# Patient Record
Sex: Male | Born: 1940
Health system: Southern US, Community
[De-identification: ages and names within clinical notes are randomized; demographics above are authoritative.]

## PROBLEM LIST (undated history)

## (undated) DIAGNOSIS — R251 Tremor, unspecified: Secondary | ICD-10-CM

## (undated) DIAGNOSIS — E785 Hyperlipidemia, unspecified: Secondary | ICD-10-CM

## (undated) DIAGNOSIS — I10 Essential (primary) hypertension: Secondary | ICD-10-CM

## (undated) DIAGNOSIS — D696 Thrombocytopenia, unspecified: Secondary | ICD-10-CM

## (undated) DIAGNOSIS — I480 Paroxysmal atrial fibrillation: Secondary | ICD-10-CM

## (undated) DIAGNOSIS — Z8669 Personal history of other diseases of the nervous system and sense organs: Secondary | ICD-10-CM

## (undated) DIAGNOSIS — T7840XA Allergy, unspecified, initial encounter: Secondary | ICD-10-CM

## (undated) DIAGNOSIS — E119 Type 2 diabetes mellitus without complications: Secondary | ICD-10-CM

## (undated) HISTORY — DX: Type 2 diabetes mellitus without complications: E11.9

## (undated) HISTORY — DX: Allergy, unspecified, initial encounter: T78.40XA

## (undated) HISTORY — DX: Thrombocytopenia, unspecified: D69.6

## (undated) HISTORY — DX: Personal history of other diseases of the nervous system and sense organs: Z86.69

## (undated) HISTORY — DX: Tremor, unspecified: R25.1

## (undated) HISTORY — DX: Paroxysmal atrial fibrillation: I48.0

## (undated) HISTORY — DX: Essential (primary) hypertension: I10

## (undated) HISTORY — DX: Hyperlipidemia, unspecified: E78.5

## (undated) HISTORY — PX: KIDNEY STONE SURGERY: SHX686

## (undated) HISTORY — PX: COLONOSCOPY: SHX174

---

## 2008-06-27 DIAGNOSIS — Z8546 Personal history of malignant neoplasm of prostate: Secondary | ICD-10-CM

## 2008-06-27 HISTORY — DX: Personal history of malignant neoplasm of prostate: Z85.46

## 2008-06-27 HISTORY — PX: PROSTATECTOMY: SHX69

## 2012-06-27 DIAGNOSIS — Z8679 Personal history of other diseases of the circulatory system: Secondary | ICD-10-CM

## 2012-06-27 HISTORY — DX: Personal history of other diseases of the circulatory system: Z86.79

## 2012-06-27 HISTORY — PX: ATRIAL FIBRILLATION ABLATION: EP1191

## 2015-06-29 ENCOUNTER — Encounter: Payer: Self-pay | Admitting: Cardiovascular Disease

## 2019-07-19 ENCOUNTER — Other Ambulatory Visit: Payer: Self-pay

## 2019-07-19 ENCOUNTER — Ambulatory Visit (INDEPENDENT_AMBULATORY_CARE_PROVIDER_SITE_OTHER): Payer: Medicare Other | Admitting: Podiatry

## 2019-07-19 ENCOUNTER — Encounter: Payer: Self-pay | Admitting: Podiatry

## 2019-07-19 DIAGNOSIS — M79674 Pain in right toe(s): Secondary | ICD-10-CM

## 2019-07-19 DIAGNOSIS — B351 Tinea unguium: Secondary | ICD-10-CM | POA: Diagnosis not present

## 2019-07-19 DIAGNOSIS — E1142 Type 2 diabetes mellitus with diabetic polyneuropathy: Secondary | ICD-10-CM | POA: Diagnosis not present

## 2019-07-19 DIAGNOSIS — M79675 Pain in left toe(s): Secondary | ICD-10-CM | POA: Diagnosis not present

## 2019-07-19 DIAGNOSIS — E1140 Type 2 diabetes mellitus with diabetic neuropathy, unspecified: Secondary | ICD-10-CM | POA: Insufficient documentation

## 2019-07-19 NOTE — Progress Notes (Signed)
This patient presents the office for diabetic foot exam as well as requesting diabetic shoes.  Patient is diabetic and is taking insulin and Metformin.  Patient has been diagnosed with diabetic neuropathy.  He presents the office today requesting diabetic shoes.    Vascular  Dorsalis pedis  palpable  B/left foot.  No palpable pulses right foot.  Capillary return  WNL.  Temperature gradient is  WNL.  Skin turgor  WNL  Sensorium  Senn Weinstein monofilament wire  absent . Normal tactile sensation.  Nail Exam  Patient has normal nails with no evidence of bacterial or fungal infection.  Orthopedic  Exam  Muscle tone and muscle strength  WNL.  No limitations of motion feet  B/L.  No crepitus or joint effusion noted.  Foot type is unremarkable and digits show no abnormalities.  Mild  HAV  B/L  Skin  No open lesions.  Normal skin texture and turgor.   Diabetic neuropathy.    IE.  Diabetic foot exam reveals no evidence of vascular pathology.  Patient has diminished/absent  LOPS  B/L.  Patient qualifies for diabetic shoes due to DPN and  HAV  B/L.  Patient to make an appointment with the pedorthikst.  RTC 1 year for annual foot exam.   Helane Gunther DPM

## 2019-07-22 ENCOUNTER — Ambulatory Visit: Payer: Medicare Other | Attending: Internal Medicine

## 2019-07-22 DIAGNOSIS — Z23 Encounter for immunization: Secondary | ICD-10-CM

## 2019-07-22 NOTE — Progress Notes (Signed)
   Covid-19 Vaccination Clinic  Name:  Man Effertz    MRN: [id number] DOB: [birth year]  07/22/2019  [name] was observed post Covid-19 immunization for 15 minutes without incidence. He was provided with Vaccine Information Sheet and instruction to access the V-Safe system.   Mr. Handler was instructed to call 911 with any severe reactions post vaccine: Marland Kitchen Difficulty breathing  . Swelling of your face and throat  . A fast heartbeat  . A bad rash all over your body  . Dizziness and weakness    Immunizations Administered    Name Date Dose VIS Date Route   Pfizer COVID-19 Vaccine 07/22/2019 11:22 AM 0.3 mL 06/07/2019 Intramuscular   Manufacturer: [employer], [employer]   Lot: [id number]   NDC: [address]-6

## 2019-07-25 ENCOUNTER — Ambulatory Visit: Payer: Medicare Other | Admitting: Orthotics

## 2019-07-25 ENCOUNTER — Ambulatory Visit (INDEPENDENT_AMBULATORY_CARE_PROVIDER_SITE_OTHER): Payer: Medicare Other | Admitting: Cardiovascular Disease

## 2019-07-25 ENCOUNTER — Encounter: Payer: Self-pay | Admitting: Cardiovascular Disease

## 2019-07-25 ENCOUNTER — Other Ambulatory Visit: Payer: Self-pay

## 2019-07-25 DIAGNOSIS — E782 Mixed hyperlipidemia: Secondary | ICD-10-CM | POA: Diagnosis not present

## 2019-07-25 DIAGNOSIS — E1142 Type 2 diabetes mellitus with diabetic polyneuropathy: Secondary | ICD-10-CM

## 2019-07-25 DIAGNOSIS — I6523 Occlusion and stenosis of bilateral carotid arteries: Secondary | ICD-10-CM

## 2019-07-25 DIAGNOSIS — I779 Disorder of arteries and arterioles, unspecified: Secondary | ICD-10-CM | POA: Insufficient documentation

## 2019-07-25 DIAGNOSIS — I4819 Other persistent atrial fibrillation: Secondary | ICD-10-CM

## 2019-07-25 DIAGNOSIS — E785 Hyperlipidemia, unspecified: Secondary | ICD-10-CM | POA: Insufficient documentation

## 2019-07-25 NOTE — Progress Notes (Signed)
Cardiology Office Note:    Date:  07/25/2019   ID:  Fernando Taylor, DOB 1940/07/30, MRN [id number]  PCP:  Darrow Bussing, MD  Cardiologist:  No primary care provider on file.  Electrophysiologist:  None   Referring MD: Darrow Bussing, MD   Chief Complaint  Patient presents with  . Atrial Fibrillation  Atrial fib   History of Present Illness:    Fernando Taylor is a 79 y.o. male with a hx of Atrial fib . - s/p ablation  Has hx of HTN, HLD , DM  With peripheral neuropathy,  Has developed an essential tremor .   Has rare episodes of indigestion  Had a stress test in late 2019 and then he moved to [facility] ( from [place])  Has lived in [place] years ago .  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   Was told to get a nuclear stress  Started back exercising - 1/2 hour of stationary bike No CP,  Also walks around the yard and house .   Recent labs from his primary medical doctor reveals a hemoglobin A1c of 7.1.  Total cholesterol is 174.  The HDL is 63.  The triglyceride level is 234.  The LDL is 73.  TSH is 2.61.  Glucose is 128.  Creatinine is 0.84.  Sodium is 141 potassium is 5.2 liver enzymes are normal.  CBC is normal.  PSA is less than 0.01.   Past Medical History:  Diagnosis Date  . Allergies   . Diabetes mellitus (HCC)   . History of prostate cancer 2010   NO RADIATION  . History of tremor    NOT HEN RESTING, MOSTLY INTENTION TREMOR AND AT A CERTAIN ANGLE, ABLE TO WRITE. HAS NOT HAD MORE EVALUATION, WAS BEING OBSERVED.  Marland Kitchen HLD (hyperlipidemia)   . Hx of atrial fibrillation without current medication 2014   S/P ABLATION IN [place],   . Hypertension     Past Surgical History:  Procedure Laterality Date  . ATRIAL FIBRILLATION ABLATION  2014   S/P  . COLONOSCOPY     DUE NOW, APRIL 2020  . KIDNEY STONE SURGERY    . PROSTATECTOMY  2010    Current Medications: Current Meds  Medication Sig  . Ascorbic Acid (VITAMIN C WITH ROSE HIPS) 500 MG tablet Take 500 mg by mouth daily. 10 mg rose hip  .  aspirin EC 81 MG tablet Take 81 mg by mouth daily.  . Cetirizine HCl 10 MG CAPS Take by mouth.  . Cholecalciferol (VITAMIN D3) 125 MCG (5000 UT) CAPS Take by mouth.  . diphenhydrAMINE HCl (ALLERGY MED PO) Take by mouth daily.  Marland Kitchen glimepiride (AMARYL) 4 MG tablet Take 4 mg by mouth daily with breakfast. Twice daily  . Krill Oil 500 MG CAPS Take by mouth.  . lovastatin (MEVACOR) 10 MG tablet Take 10 mg by mouth at bedtime.  . Magnesium 250 MG TABS Take by mouth daily.  Marland Kitchen METOPROLOL TARTRATE PO Take 25 mg by mouth 2 (two) times daily.  . Multiple Vitamin (MULTIVITAMIN) tablet Take 1 tablet by mouth daily.  . ramipril (ALTACE) 2.5 MG capsule Take 2.5 mg by mouth daily.  . repaglinide (PRANDIN) 1 MG tablet Take 1 mg by mouth 3 (three) times daily before meals. 1 tablet am,2 tablets  pm  . sitaGLIPtin-metformin (JANUMET) 50-1000 MG tablet Take 1 tablet by mouth 2 (two) times daily with a meal.  . SODIUM FLUORIDE 5000 SENSITIVE 1.1-5 % PSTE BRUSH WITH TWICE DAILY     Allergies:  Patient has no known allergies.   Social History   Socioeconomic History  . Marital status: Married    Spouse name: Not on file  . Number of children: Not on file  . Years of education: Not on file  . Highest education level: Not on file  Occupational History  . Not on file  Tobacco Use  . Smoking status: Never Smoker  . Smokeless tobacco: Never Used  Substance and Sexual Activity  . Alcohol use: Never  . Drug use: Never  . Sexual activity: Not on file  Other Topics Concern  . Not on file  Social History Narrative  . Not on file   Social Determinants of Health   Financial Resource Strain:   . Difficulty of Paying Living Expenses: Not on file  Food Insecurity:   . Worried About Programme researcher, broadcasting/film/video in the Last Year: Not on file  . Ran Out of Food in the Last Year: Not on file  Transportation Needs:   . Lack of Transportation (Medical): Not on file  . Lack of Transportation (Non-Medical): Not on file   Physical Activity:   . Days of Exercise per Week: Not on file  . Minutes of Exercise per Session: Not on file  Stress:   . Feeling of Stress : Not on file  Social Connections:   . Frequency of Communication with Friends and Family: Not on file  . Frequency of Social Gatherings with Friends and Family: Not on file  . Attends Religious Services: Not on file  . Active Member of Clubs or Organizations: Not on file  . Attends Banker Meetings: Not on file  . Marital Status: Not on file     Family History: The patient's family history includes CAD in his brother and father; Cerebrovascular Accident in his mother; Diabetes in his mother; Pulmonary fibrosis in his sister; Stroke in his brother.  ROS:   Please see the history of present illness.     All other systems reviewed and are negative.  EKGs/Labs/Other Studies Reviewed:    The following studies were reviewed today:   EKG:   Jan. 28, 2021 Recent Labs: No results found for requested labs within last 8760 hours.  Recent Lipid Panel No results found for: CHOL, TRIG, HDL, CHOLHDL, VLDL, LDLCALC, LDLDIRECT  Physical Exam:    VS:  BP 126/62   Pulse 65   Ht 5\' 11"  (1.803 m)   Wt 211 lb 12.8 oz (96.1 kg)   SpO2 (!) 65%   BMI 29.54 kg/m     Wt Readings from Last 3 Encounters:  07/25/19 211 lb 12.8 oz (96.1 kg)     GEN:  Well nourished, well developed in no acute distress HEENT: Normal NECK: No JVD; No carotid bruits LYMPHATICS: No lymphadenopathy CARDIAC: RRR, soft systolic murmur. RESPIRATORY:  Clear to auscultation without rales, wheezing or rhonchi  ABDOMEN: Soft, non-tender, non-distended MUSCULOSKELETAL:  No edema; No deformity  SKIN: Warm and dry NEUROLOGIC:  Alert and oriented x 3 PSYCHIATRIC:  Normal affect   ASSESSMENT:    No diagnosis found. PLAN:    In order of problems listed above:  1. History of atrial fibrillation-status post RF ablation.  Patient is in normal sinus rhythm.  He is not  had any recurrent atrial fibrillation.  We will continue to follow.  2.  Mild carotid artery disease: The patient has had a carotid scan in the past that revealed mild bilateral carotid artery disease.  Does not have  any bruits on exam.  3.  Hyperlipidemia: He is on lovastatin 10 mg a day.  His LDL is 73.  He does have elevated triglyceride levels, hemoglobin A1c, glucose levels.  I have cautioned him about eating anything white, weak, sweet.  Further management per his primary medical doctor.  2.  Hypertension: Blood pressures well controlled.  Continue current medications.   Medication Adjustments/Labs and Tests Ordered: Current medicines are reviewed at length with the patient today.  Concerns regarding medicines are outlined above.  No orders of the defined types were placed in this encounter.  No orders of the defined types were placed in this encounter.   Patient Instructions  Medication Instructions:  Your physician recommends that you continue on your current medications as directed. Please refer to the Current Medication list given to you today.  *If you need a refill on your cardiac medications before your next appointment, please call your pharmacy*  Lab Work: None Ordered If you have labs (blood work) drawn today and your tests are completely normal, you will receive your results only by: Marland Kitchen MyChart Message (if you have MyChart) OR . A paper copy in the mail If you have any lab test that is abnormal or we need to change your treatment, we will call you to review the results.   Testing/Procedures: None Ordered   Follow-Up: At [facility], you and your health needs are our priority.  As part of our continuing mission to provide you with exceptional heart care, we have created designated Provider Care Teams.  These Care Teams include your primary Cardiologist (physician) and Advanced Practice Providers (APPs -  Physician Assistants and Nurse Practitioners) who all work  together to provide you with the care you need, when you need it.  Your next appointment:   1 year(s)  The format for your next appointment:   In Person  Provider:   You may see Mertie Moores, MD or one of the following Advanced Practice Providers on your designated Care Team:    Richardson Dopp, PA-C  Vin [place], [place]  Daune Perch, [place]       Signed, Mertie Moores, MD  07/25/2019 6:30 PM    [place]

## 2019-07-25 NOTE — Progress Notes (Signed)

## 2019-07-25 NOTE — Patient Instructions (Signed)

## 2019-07-26 ENCOUNTER — Telehealth: Payer: Self-pay | Admitting: Nurse Practitioner

## 2019-07-26 NOTE — Addendum Note (Signed)
Addended by: Levi Aland on: 07/26/2019 01:00 PM   Modules accepted: Orders

## 2019-07-26 NOTE — Telephone Encounter (Signed)
Received call from patient who called to ask about documentation of O2 sat of 65% documented on his AVS. I apologized for the error and advised that unfortunately I do not have the paper copy of his vital signs from yesterday and cannot give him the accurate reading. He states he think it might have been the same number documented for pulse by mistake or I offered the suggestion that it could have ben a 6 typed in the place of a 9 since the two numbers are close to one another on the key board. Patient states he is feeling fine and thanked me for the reassurance.

## 2019-08-12 ENCOUNTER — Ambulatory Visit: Payer: Medicare Other | Attending: Internal Medicine

## 2019-08-12 DIAGNOSIS — Z23 Encounter for immunization: Secondary | ICD-10-CM | POA: Insufficient documentation

## 2019-08-12 NOTE — Progress Notes (Signed)
   Covid-19 Vaccination Clinic  Name:  Fernando Taylor    MRN: [id number] DOB: [birth year]  08/12/2019  Mr. Fernando Taylor was observed post Covid-19 immunization for 15 minutes without incidence. He was provided with Vaccine Information Sheet and instruction to access the V-Safe system.   Mr. Fernando Taylor was instructed to call 911 with any severe reactions post vaccine: Marland Kitchen Difficulty breathing  . Swelling of your face and throat  . A fast heartbeat  . A bad rash all over your body  . Dizziness and weakness    Immunizations Administered    Name Date Dose VIS Date Route   Pfizer COVID-19 Vaccine 08/12/2019 11:17 AM 0.3 mL 06/07/2019 Intramuscular   Manufacturer: [employer], [employer]   Lot: [id number]   NDC: [address]-3

## 2019-10-18 ENCOUNTER — Ambulatory Visit (INDEPENDENT_AMBULATORY_CARE_PROVIDER_SITE_OTHER): Payer: Medicare Other | Admitting: Podiatry

## 2019-10-18 ENCOUNTER — Other Ambulatory Visit: Payer: Self-pay

## 2019-10-18 ENCOUNTER — Encounter: Payer: Self-pay | Admitting: Podiatry

## 2019-10-18 VITALS — Temp 97.2°F

## 2019-10-18 DIAGNOSIS — B351 Tinea unguium: Secondary | ICD-10-CM | POA: Diagnosis not present

## 2019-10-18 DIAGNOSIS — M79675 Pain in left toe(s): Secondary | ICD-10-CM

## 2019-10-18 DIAGNOSIS — E1142 Type 2 diabetes mellitus with diabetic polyneuropathy: Secondary | ICD-10-CM

## 2019-10-18 DIAGNOSIS — M79674 Pain in right toe(s): Secondary | ICD-10-CM

## 2019-10-18 NOTE — Progress Notes (Signed)
This patient returns to my office for at risk foot care.  This patient requires this care by a professional since this patient will be at risk due to having diabetes neuropathy.  This patient is unable to cut nails himself since the patient cannot reach his nails.These nails are painful walking and wearing shoes.  This patient presents for at risk foot care today.  General Appearance  Alert, conversant and in no acute stress.  Vascular  Dorsalis pedis and posterior tibial  pulses are palpable  bilaterally.  Capillary return is within normal limits  bilaterally. Temperature is within normal limits  bilaterally.  Neurologic  Senn-Weinstein monofilament wire test within normal limits  bilaterally. Muscle power within normal limits bilaterally.  Nails Thick disfigured discolored nails with subungual debris  Hallux nails  B/L. No evidence of bacterial infection or drainage bilaterally.  Orthopedic  No limitations of motion  feet .  No crepitus or effusions noted.  No bony pathology or digital deformities noted.  Skin  normotropic skin with no porokeratosis noted bilaterally.  No signs of infections or ulcers noted.     Onychomycosis  Pain in right toes  Pain in left toes  Consent was obtained for treatment procedures.   Mechanical debridement of nails 1-5  bilaterally performed with a nail nipper.  Filed with dremel without incident.    Return office visit   3 months                  Told patient to return for periodic foot care and evaluation due to potential at risk complications.   Josceline Chenard DPM  

## 2019-12-05 ENCOUNTER — Other Ambulatory Visit: Payer: Self-pay

## 2019-12-05 ENCOUNTER — Ambulatory Visit (INDEPENDENT_AMBULATORY_CARE_PROVIDER_SITE_OTHER): Payer: Medicare Other | Admitting: Orthotics

## 2019-12-05 DIAGNOSIS — E1142 Type 2 diabetes mellitus with diabetic polyneuropathy: Secondary | ICD-10-CM

## 2020-01-22 ENCOUNTER — Encounter: Payer: Self-pay | Admitting: Podiatry

## 2020-01-22 ENCOUNTER — Ambulatory Visit (INDEPENDENT_AMBULATORY_CARE_PROVIDER_SITE_OTHER): Payer: Medicare Other | Admitting: Podiatry

## 2020-01-22 ENCOUNTER — Other Ambulatory Visit: Payer: Self-pay

## 2020-01-22 DIAGNOSIS — E1142 Type 2 diabetes mellitus with diabetic polyneuropathy: Secondary | ICD-10-CM

## 2020-01-22 DIAGNOSIS — M79674 Pain in right toe(s): Secondary | ICD-10-CM

## 2020-01-22 DIAGNOSIS — M79675 Pain in left toe(s): Secondary | ICD-10-CM | POA: Diagnosis not present

## 2020-01-22 DIAGNOSIS — B351 Tinea unguium: Secondary | ICD-10-CM

## 2020-01-22 NOTE — Progress Notes (Signed)
This patient returns to my office for at risk foot care.  This patient requires this care by a professional since this patient will be at risk due to having diabetes neuropathy.  This patient is unable to cut nails himself since the patient cannot reach his nails.These nails are painful walking and wearing shoes.  This patient presents for at risk foot care today.  General Appearance  Alert, conversant and in no acute stress.  Vascular  Dorsalis pedis and posterior tibial  pulses are palpable  bilaterally.  Capillary return is within normal limits  bilaterally. Temperature is within normal limits  bilaterally.  Neurologic  Senn-Weinstein monofilament wire test within normal limits  bilaterally. Muscle power within normal limits bilaterally.  Nails Thick disfigured discolored nails with subungual debris  Hallux nails  B/L. No evidence of bacterial infection or drainage bilaterally.  Orthopedic  No limitations of motion  feet .  No crepitus or effusions noted.  No bony pathology or digital deformities noted.  Skin  normotropic skin with no porokeratosis noted bilaterally.  No signs of infections or ulcers noted.     Onychomycosis  Pain in right toes  Pain in left toes  Consent was obtained for treatment procedures.   Mechanical debridement of nails 1-5  bilaterally performed with a nail nipper.  Filed with dremel without incident.    Return office visit   3 months                  Told patient to return for periodic foot care and evaluation due to potential at risk complications.   Berdena Cisek DPM  

## 2020-04-29 ENCOUNTER — Encounter: Payer: Self-pay | Admitting: Podiatry

## 2020-04-29 ENCOUNTER — Ambulatory Visit (INDEPENDENT_AMBULATORY_CARE_PROVIDER_SITE_OTHER): Payer: Medicare Other | Admitting: Podiatry

## 2020-04-29 ENCOUNTER — Other Ambulatory Visit: Payer: Self-pay

## 2020-04-29 DIAGNOSIS — M79675 Pain in left toe(s): Secondary | ICD-10-CM | POA: Diagnosis not present

## 2020-04-29 DIAGNOSIS — M79674 Pain in right toe(s): Secondary | ICD-10-CM | POA: Diagnosis not present

## 2020-04-29 DIAGNOSIS — B351 Tinea unguium: Secondary | ICD-10-CM | POA: Diagnosis not present

## 2020-04-29 DIAGNOSIS — E1142 Type 2 diabetes mellitus with diabetic polyneuropathy: Secondary | ICD-10-CM

## 2020-04-29 NOTE — Progress Notes (Signed)
This patient returns to my office for at risk foot care.  This patient requires this care by a professional since this patient will be at risk due to having diabetes neuropathy.  This patient is unable to cut nails himself since the patient cannot reach his nails.These nails are painful walking and wearing shoes.  This patient presents for at risk foot care today.  General Appearance  Alert, conversant and in no acute stress.  Vascular  Dorsalis pedis and posterior tibial  pulses are palpable  bilaterally.  Capillary return is within normal limits  bilaterally. Temperature is within normal limits  bilaterally.  Neurologic  Senn-Weinstein monofilament wire test within normal limits  bilaterally. Muscle power within normal limits bilaterally.  Nails Thick disfigured discolored nails with subungual debris  Hallux nails  B/L. No evidence of bacterial infection or drainage bilaterally.  Orthopedic  No limitations of motion  feet .  No crepitus or effusions noted.  No bony pathology or digital deformities noted.  Skin  normotropic skin with no porokeratosis noted bilaterally.  No signs of infections or ulcers noted.     Onychomycosis  Pain in right toes  Pain in left toes  Consent was obtained for treatment procedures.   Mechanical debridement of nails 1-5  bilaterally performed with a nail nipper.  Filed with dremel without incident.    Return office visit   3 months                  Told patient to return for periodic foot care and evaluation due to potential at risk complications.   Lovette Merta DPM  

## 2020-08-05 ENCOUNTER — Other Ambulatory Visit: Payer: Self-pay

## 2020-08-05 ENCOUNTER — Encounter: Payer: Self-pay | Admitting: Podiatry

## 2020-08-05 ENCOUNTER — Ambulatory Visit (INDEPENDENT_AMBULATORY_CARE_PROVIDER_SITE_OTHER): Payer: Medicare Other | Admitting: Podiatry

## 2020-08-05 DIAGNOSIS — M79675 Pain in left toe(s): Secondary | ICD-10-CM

## 2020-08-05 DIAGNOSIS — B351 Tinea unguium: Secondary | ICD-10-CM | POA: Diagnosis not present

## 2020-08-05 DIAGNOSIS — M79674 Pain in right toe(s): Secondary | ICD-10-CM | POA: Diagnosis not present

## 2020-08-05 DIAGNOSIS — E1142 Type 2 diabetes mellitus with diabetic polyneuropathy: Secondary | ICD-10-CM

## 2020-08-05 NOTE — Progress Notes (Signed)
This patient returns to my office for at risk foot care.  This patient requires this care by a professional since this patient will be at risk due to having diabetes neuropathy.  This patient is unable to cut nails himself since the patient cannot reach his nails.These nails are painful walking and wearing shoes.  This patient presents for at risk foot care today.  General Appearance  Alert, conversant and in no acute stress.  Vascular  Dorsalis pedis and posterior tibial  pulses are palpable  bilaterally.  Capillary return is within normal limits  bilaterally. Temperature is within normal limits  bilaterally.  Neurologic  Senn-Weinstein monofilament wire test within normal limits  bilaterally. Muscle power within normal limits bilaterally.  Nails Thick disfigured discolored nails with subungual debris  Hallux nails  B/L. No evidence of bacterial infection or drainage bilaterally.  Orthopedic  No limitations of motion  feet .  No crepitus or effusions noted.  No bony pathology or digital deformities noted.  Skin  normotropic skin with no porokeratosis noted bilaterally.  No signs of infections or ulcers noted.     Onychomycosis  Pain in right toes  Pain in left toes  Consent was obtained for treatment procedures.   Mechanical debridement of nails 1-5  bilaterally performed with a nail nipper.  Filed with dremel without incident.    Return office visit   3 months                  Told patient to return for periodic foot care and evaluation due to potential at risk complications.   Helane Gunther DPM

## 2020-11-04 ENCOUNTER — Ambulatory Visit: Payer: Medicare Other | Admitting: Podiatry

## 2020-11-24 ENCOUNTER — Ambulatory Visit: Payer: Medicare Other | Admitting: Podiatry

## 2020-12-07 ENCOUNTER — Other Ambulatory Visit: Payer: Self-pay

## 2020-12-07 ENCOUNTER — Encounter: Payer: Self-pay | Admitting: Podiatry

## 2020-12-07 ENCOUNTER — Ambulatory Visit (INDEPENDENT_AMBULATORY_CARE_PROVIDER_SITE_OTHER): Payer: Medicare Other | Admitting: Podiatry

## 2020-12-07 DIAGNOSIS — E1142 Type 2 diabetes mellitus with diabetic polyneuropathy: Secondary | ICD-10-CM | POA: Diagnosis not present

## 2020-12-07 DIAGNOSIS — B351 Tinea unguium: Secondary | ICD-10-CM

## 2020-12-07 DIAGNOSIS — M79674 Pain in right toe(s): Secondary | ICD-10-CM | POA: Diagnosis not present

## 2020-12-07 DIAGNOSIS — M79675 Pain in left toe(s): Secondary | ICD-10-CM

## 2020-12-07 NOTE — Progress Notes (Addendum)
This patient returns to my office for at risk foot care.  This patient requires this care by a professional since this patient will be at risk due to having diabetes neuropathy.  This patient is unable to cut nails himself since the patient cannot reach his nails.These nails are painful walking and wearing shoes.  This patient presents for at risk foot care today.  General Appearance  Alert, conversant and in no acute stress.  Vascular  Dorsalis pedis and posterior tibial  pulses are palpable  bilaterally.  Capillary return is within normal limits  bilaterally. Temperature is within normal limits  bilaterally.  Neurologic  Senn-Weinstein monofilament wire test absent bilaterally. Muscle power diminished bilaterally.  Nails Thick disfigured discolored nails with subungual debris  Hallux nails  B/L. No evidence of bacterial infection or drainage bilaterally.  Orthopedic  No limitations of motion  feet .  No crepitus or effusions noted.  No bony pathology or digital deformities noted.  Mild  HAV  B/L.  Skin  normotropic skin with no porokeratosis noted bilaterally.  No signs of infections or ulcers noted.     Onychomycosis  Pain in right toes  Pain in left toes  Consent was obtained for treatment procedures.   Mechanical debridement of nails 1-5  bilaterally performed with a nail nipper.  Filed with dremel without incident.  Patient qualifies for diabetic shoes due to DPN and HAV  B/L.   Return office visit   4  months                  Told patient to return for periodic foot care and evaluation due to potential at risk complications.   Helane Gunther DPM

## 2020-12-14 ENCOUNTER — Other Ambulatory Visit: Payer: Self-pay

## 2020-12-14 ENCOUNTER — Ambulatory Visit (INDEPENDENT_AMBULATORY_CARE_PROVIDER_SITE_OTHER): Payer: Medicare Other

## 2020-12-14 DIAGNOSIS — E1142 Type 2 diabetes mellitus with diabetic polyneuropathy: Secondary | ICD-10-CM

## 2020-12-14 NOTE — Progress Notes (Signed)
Patient in office today to be measured for diabetic shoes. Patient is a size 12 in men's shoes with a medium width. Dr. Docia Chuck is treating the patient's diabetes at this time. Patient selected [id number] [employer] Knit (black) as his 1st shoe option and [address] (brown) as his 2nd shoe option. Patient advised the the office will call when shoes are ready for pick-up. Patient verbalized understanding.

## 2021-02-11 ENCOUNTER — Ambulatory Visit (INDEPENDENT_AMBULATORY_CARE_PROVIDER_SITE_OTHER): Payer: Medicare Other | Admitting: *Deleted

## 2021-02-11 ENCOUNTER — Other Ambulatory Visit: Payer: Self-pay

## 2021-02-11 DIAGNOSIS — M2042 Other hammer toe(s) (acquired), left foot: Secondary | ICD-10-CM | POA: Diagnosis not present

## 2021-02-11 DIAGNOSIS — M2041 Other hammer toe(s) (acquired), right foot: Secondary | ICD-10-CM | POA: Diagnosis not present

## 2021-02-11 DIAGNOSIS — E1142 Type 2 diabetes mellitus with diabetic polyneuropathy: Secondary | ICD-10-CM | POA: Diagnosis not present

## 2021-02-11 NOTE — Progress Notes (Signed)
Patient presents today to pick up diabetic shoes and insoles.  Patient was dispensed 1 pair of diabetic shoes and 3 pairs of foam casted diabetic insoles. Fit was satisfactory. Instructions for break-in and wear was reviewed and a copy was given to the patient.   Re-appointment for regularly scheduled diabetic foot care visits or if they should experience any trouble with the shoes or insoles.  

## 2021-02-16 ENCOUNTER — Encounter: Payer: Self-pay | Admitting: *Deleted

## 2021-02-17 ENCOUNTER — Encounter: Payer: Self-pay | Admitting: Diagnostic Neuroimaging

## 2021-02-17 ENCOUNTER — Other Ambulatory Visit: Payer: Self-pay

## 2021-02-17 ENCOUNTER — Ambulatory Visit (INDEPENDENT_AMBULATORY_CARE_PROVIDER_SITE_OTHER): Payer: Medicare Other | Admitting: Diagnostic Neuroimaging

## 2021-02-17 VITALS — BP 136/81 | HR 76 | Ht 71.0 in | Wt 198.0 lb

## 2021-02-17 DIAGNOSIS — G250 Essential tremor: Secondary | ICD-10-CM | POA: Diagnosis not present

## 2021-02-17 NOTE — Patient Instructions (Signed)
-   mild essential tremor; monitor symptoms; consider propranolol or primidone in future

## 2021-02-17 NOTE — Progress Notes (Signed)
[facility]  PATIENT: Fernando Taylor DOB: 1941/06/19  REFERRING CLINICIAN: Koirala, Dibas, MD HISTORY FROM: patient REASON FOR VISIT: new consult   HISTORICAL  CHIEF COMPLAINT:  Chief Complaint  Patient presents with   Tremors    RM 7 alone Pt is well, tremors started in L hand about 4-5 yrs ago. Over the last yr he has notice it has started in R hand.  Neuropathy in feet     HISTORY OF PRESENT ILLNESS:   80 year old male here for evaluation of tremor.  Symptoms started around 2018 have gradually progressed over time.  Initially noticed some mild tremor in left hand but this is progressed to the right hand.  Symptoms mainly affecting him with certain posture and actions.  He notes difficulty when tying a knot on a fishing pole or using a screwdriver.  Patient also has 15 to 20-year history of diabetes with 10 to 15-year history of mild numbness in the feet and legs.  Hemoglobin A1c's have ranged from 6.2 up to 7.3 currently.  He is on medication.   REVIEW OF SYSTEMS: Full 14 system review of systems performed and negative with exception of: as per HPI.  ALLERGIES: No Known Allergies  HOME MEDICATIONS: Outpatient Medications Prior to Visit  Medication Sig Dispense Refill   Ascorbic Acid (VITAMIN C WITH ROSE HIPS) 500 MG tablet Take 500 mg by mouth daily. 10 mg rose hip     aspirin EC 81 MG tablet Take 81 mg by mouth daily.     Cetirizine HCl 10 MG CAPS Take by mouth.     Cholecalciferol (VITAMIN D3) 125 MCG (5000 UT) CAPS Take by mouth.     diphenhydrAMINE HCl (ALLERGY MED PO) Take by mouth daily.     glimepiride (AMARYL) 4 MG tablet Take 4 mg by mouth daily with breakfast. Twice daily     JARDIANCE 10 MG TABS tablet      Krill Oil 500 MG CAPS Take by mouth.     lovastatin (MEVACOR) 10 MG tablet Take 10 mg by mouth at bedtime.     Magnesium 250 MG TABS Take by mouth daily.     metFORMIN (GLUCOPHAGE) 1000 MG tablet      METOPROLOL TARTRATE PO Take 25  mg by mouth 2 (two) times daily.     Multiple Vitamin (MULTIVITAMIN) tablet Take 1 tablet by mouth daily.     ONETOUCH ULTRA test strip daily. use as directed     ramipril (ALTACE) 2.5 MG capsule Take 2.5 mg by mouth daily.     repaglinide (PRANDIN) 1 MG tablet Take 1 mg by mouth 3 (three) times daily before meals. 1 tablet am,2 tablets  pm     SODIUM FLUORIDE 5000 PPM DT SMARTSIG:By Mouth     SODIUM FLUORIDE 5000 SENSITIVE 1.1-5 % PSTE BRUSH WITH TWICE DAILY     sitaGLIPtin-metformin (JANUMET) 50-1000 MG tablet Take 1 tablet by mouth 2 (two) times daily with a meal.     No facility-administered medications prior to visit.    PAST MEDICAL HISTORY: Past Medical History:  Diagnosis Date   Allergies    Diabetes mellitus (HCC)    History of prostate cancer 2010   NO RADIATION   History of tremor    NOT HEN RESTING, MOSTLY INTENTION TREMOR AND AT A CERTAIN ANGLE, ABLE TO WRITE. HAS NOT HAD MORE EVALUATION, WAS BEING OBSERVED.   HLD (hyperlipidemia)    Hx of atrial fibrillation without current medication 2014   S/P  ABLATION IN [place],    Hypertension    Paroxysmal atrial fibrillation (HCC)    Thrombocytopenia (HCC)    Tremor     PAST SURGICAL HISTORY: Past Surgical History:  Procedure Laterality Date   ATRIAL FIBRILLATION ABLATION  2014   S/P   COLONOSCOPY     DUE NOW, APRIL 2020   KIDNEY STONE SURGERY     PROSTATECTOMY  2010    FAMILY HISTORY: Family History  Problem Relation Age of Onset   Cerebrovascular Accident Mother    Diabetes Mother    CAD Father    Pulmonary fibrosis Sister    Stroke Brother    CAD Brother     SOCIAL HISTORY: Social History   Socioeconomic History   Marital status: Married    Spouse name: Not on file   Number of children: 2   Years of education: Not on file   Highest education level: Not on file  Occupational History   Not on file  Tobacco Use   Smoking status: Never   Smokeless tobacco: Never  Vaping Use   Vaping Use: Never used   Substance and Sexual Activity   Alcohol use: Never   Drug use: Never   Sexual activity: Not on file  Other Topics Concern   Not on file  Social History Narrative   Lives with wife   Social Determinants of Health   Financial Resource Strain: Not on file  Food Insecurity: Not on file  Transportation Needs: Not on file  Physical Activity: Not on file  Stress: Not on file  Social Connections: Not on file  Intimate Partner Violence: Not on file     PHYSICAL EXAM  GENERAL EXAM/CONSTITUTIONAL: Vitals:  Vitals:   02/17/21 0900  BP: 136/81  Pulse: 76  Weight: 198 lb (89.8 kg)  Height: 5\' 11"  (1.803 m)   Body mass index is 27.62 kg/m. Wt Readings from Last 3 Encounters:  02/17/21 198 lb (89.8 kg)  07/25/19 211 lb 12.8 oz (96.1 kg)   Patient is in no distress; well developed, nourished and groomed; neck is supple  CARDIOVASCULAR: Examination of carotid arteries is normal; no carotid bruits Regular rate and rhythm, no murmurs Examination of peripheral vascular system by observation and palpation is normal  EYES: Ophthalmoscopic exam of optic discs and posterior segments is normal; no papilledema or hemorrhages No results found.  MUSCULOSKELETAL: Gait, strength, tone, movements noted in Neurologic exam below  NEUROLOGIC: MENTAL STATUS:  No flowsheet data found. awake, alert, oriented to person, place and time recent and remote memory intact normal attention and concentration language fluent, comprehension intact, naming intact fund of knowledge appropriate  CRANIAL NERVE:  2nd - no papilledema on fundoscopic exam 2nd, 3rd, 4th, 6th - pupils equal and reactive to light, visual fields full to confrontation, extraocular muscles intact, no nystagmus 5th - facial sensation symmetric 7th - facial strength symmetric 8th - hearing intact 9th - palate elevates symmetrically, uvula midline 11th - shoulder shrug symmetric 12th - tongue protrusion midline  MOTOR:   normal bulk and tone, full strength in the BUE, BLE MILD POSTURAL AND ACTION TREMOR IN LUE > RUE  SENSORY:  normal and symmetric to light touch, temperature, vibration;EXCEPT DECR IN FEET SLIGHTLY  COORDINATION:  finger-nose-finger, fine finger movements normal  REFLEXES:  deep tendon reflexes present and symmetric  GAIT/STATION:  narrow based gait     DIAGNOSTIC DATA (LABS, IMAGING, TESTING) - I reviewed patient records, labs, notes, testing and imaging myself where available.  No results found for: WBC, HGB, HCT, MCV, PLT No results found for: NA, K, CL, CO2, GLUCOSE, BUN, CREATININE, CALCIUM, PROT, ALBUMIN, AST, ALT, ALKPHOS, BILITOT, GFRNONAA, GFRAA No results found for: CHOL, HDL, LDLCALC, LDLDIRECT, TRIG, CHOLHDL No results found for: XUXY3F No results found for: VITAMINB12 No results found for: TSH   2016 MRI brain - mild non-specific chronic small vessel ischemic disease     ASSESSMENT AND PLAN  80 y.o. year old male here with:   Dx:  1. Essential tremor       PLAN:  POSTURAL / ACTION TREMOR (likely essential tremor; no signs for Parkinson's at this time) - monitor symptoms; consider propranolol or primidone in future  DIABETIC NEUROPATHY (10 to 15 years of numbness in the feet; 15 to 20 years of diabetes) - mild symptoms; continue DM control   Return for return to PCP, pending if symptoms worsen or fail to improve.    Suanne Marker, MD 02/17/2021, 9:59 AM Certified in Neurology, Neurophysiology and Neuroimaging  [facility] [address] [place], [place] [address] [phone]

## 2021-03-15 ENCOUNTER — Other Ambulatory Visit: Payer: Self-pay

## 2021-03-15 ENCOUNTER — Ambulatory Visit (INDEPENDENT_AMBULATORY_CARE_PROVIDER_SITE_OTHER): Payer: Medicare Other | Admitting: Podiatry

## 2021-03-15 ENCOUNTER — Encounter: Payer: Self-pay | Admitting: Podiatry

## 2021-03-15 DIAGNOSIS — E1142 Type 2 diabetes mellitus with diabetic polyneuropathy: Secondary | ICD-10-CM

## 2021-03-15 DIAGNOSIS — M79674 Pain in right toe(s): Secondary | ICD-10-CM

## 2021-03-15 DIAGNOSIS — B351 Tinea unguium: Secondary | ICD-10-CM

## 2021-03-15 DIAGNOSIS — M79675 Pain in left toe(s): Secondary | ICD-10-CM

## 2021-03-15 NOTE — Progress Notes (Signed)
This patient returns to my office for at risk foot care.  This patient requires this care by a professional since this patient will be at risk due to having diabetes neuropathy.  This patient is unable to cut nails himself since the patient cannot reach his nails.These nails are painful walking and wearing shoes.  This patient presents for at risk foot care today. Patient has received his diabetic shoes.  General Appearance  Alert, conversant and in no acute stress.  Vascular  Dorsalis pedis and posterior tibial  pulses are palpable  bilaterally.  Capillary return is within normal limits  bilaterally. Temperature is within normal limits  bilaterally.  Neurologic  Senn-Weinstein monofilament wire test absent bilaterally. Muscle power diminished bilaterally.  Nails Thick disfigured discolored nails with subungual debris  Hallux nails  B/L. No evidence of bacterial infection or drainage bilaterally.  Orthopedic  No limitations of motion  feet .  No crepitus or effusions noted.  No bony pathology or digital deformities noted.  Mild  HAV  B/L.  Skin  normotropic skin with no porokeratosis noted bilaterally.  No signs of infections or ulcers noted.     Onychomycosis  Pain in right toes  Pain in left toes  Consent was obtained for treatment procedures.   Mechanical debridement of nails 1-5  bilaterally performed with a nail nipper.  Filed with dremel without incident.  Patient qualifies for diabetic shoes due to DPN and HAV  B/L.   Return office visit   3   months                  Told patient to return for periodic foot care and evaluation due to potential at risk complications.   Helane Gunther DPM

## 2021-06-09 ENCOUNTER — Other Ambulatory Visit: Payer: Self-pay | Admitting: Family Medicine

## 2021-06-09 DIAGNOSIS — R0989 Other specified symptoms and signs involving the circulatory and respiratory systems: Secondary | ICD-10-CM

## 2021-06-16 ENCOUNTER — Ambulatory Visit (INDEPENDENT_AMBULATORY_CARE_PROVIDER_SITE_OTHER): Payer: Medicare Other | Admitting: Podiatry

## 2021-06-16 ENCOUNTER — Encounter: Payer: Self-pay | Admitting: Podiatry

## 2021-06-16 ENCOUNTER — Other Ambulatory Visit: Payer: Self-pay

## 2021-06-16 DIAGNOSIS — E1142 Type 2 diabetes mellitus with diabetic polyneuropathy: Secondary | ICD-10-CM | POA: Diagnosis not present

## 2021-06-16 DIAGNOSIS — M79674 Pain in right toe(s): Secondary | ICD-10-CM | POA: Diagnosis not present

## 2021-06-16 DIAGNOSIS — B351 Tinea unguium: Secondary | ICD-10-CM | POA: Diagnosis not present

## 2021-06-16 DIAGNOSIS — M2042 Other hammer toe(s) (acquired), left foot: Secondary | ICD-10-CM

## 2021-06-16 DIAGNOSIS — M2041 Other hammer toe(s) (acquired), right foot: Secondary | ICD-10-CM

## 2021-06-16 DIAGNOSIS — M79675 Pain in left toe(s): Secondary | ICD-10-CM | POA: Diagnosis not present

## 2021-06-16 NOTE — Progress Notes (Signed)
This patient returns to my office for at risk foot care.  This patient requires this care by a professional since this patient will be at risk due to having diabetes neuropathy.  This patient is unable to cut nails himself since the patient cannot reach his nails.These nails are painful walking and wearing shoes.  This patient presents for at risk foot care today. Patient has received his diabetic shoes.  General Appearance  Alert, conversant and in no acute stress.  Vascular  Dorsalis pedis and posterior tibial  pulses are palpable  bilaterally.  Capillary return is within normal limits  bilaterally. Temperature is within normal limits  bilaterally.  Neurologic  Senn-Weinstein monofilament wire test absent bilaterally. Muscle power diminished bilaterally.  Nails Thick disfigured discolored nails with subungual debris  Hallux nails  B/L. No evidence of bacterial infection or drainage bilaterally.  Orthopedic  No limitations of motion  feet .  No crepitus or effusions noted.  No bony pathology or digital deformities noted.  Mild  HAV  B/L.  Skin  normotropic skin with no porokeratosis noted bilaterally.  No signs of infections or ulcers noted.     Onychomycosis  Pain in right toes  Pain in left toes  Consent was obtained for treatment procedures.   Mechanical debridement of nails 1-5  bilaterally performed with a nail nipper.  Filed with dremel without incident.     Return office visit   3   months                  Told patient to return for periodic foot care and evaluation due to potential at risk complications.   Helane Gunther DPM

## 2021-06-22 ENCOUNTER — Other Ambulatory Visit: Payer: Medicare Other

## 2021-08-12 ENCOUNTER — Ambulatory Visit
Admission: RE | Admit: 2021-08-12 | Discharge: 2021-08-12 | Disposition: A | Discharge status: Home / Self Care | Payer: Medicare Other | Source: Ambulatory Visit | Attending: Family Medicine | Admitting: Family Medicine

## 2021-08-12 DIAGNOSIS — R0989 Other specified symptoms and signs involving the circulatory and respiratory systems: Secondary | ICD-10-CM

## 2021-08-17 ENCOUNTER — Encounter: Payer: Self-pay | Admitting: Cardiovascular Disease

## 2021-08-17 ENCOUNTER — Ambulatory Visit (INDEPENDENT_AMBULATORY_CARE_PROVIDER_SITE_OTHER): Payer: Medicare Other | Admitting: Cardiovascular Disease

## 2021-08-17 ENCOUNTER — Other Ambulatory Visit: Payer: Self-pay

## 2021-08-17 VITALS — BP 128/76 | HR 66 | Ht 71.0 in | Wt 195.6 lb

## 2021-08-17 DIAGNOSIS — I4819 Other persistent atrial fibrillation: Secondary | ICD-10-CM | POA: Diagnosis not present

## 2021-08-17 NOTE — Progress Notes (Signed)
Cardiology Office Note:    Date:  08/17/2021   ID:  [name], DOB 05/17/1941, MRN [id number]  PCP:  Lujean Amel, MD  Cardiologist:  None  Electrophysiologist:  None   Referring MD: Lujean Amel, MD   Chief Complaint  Patient presents with   Atrial Fibrillation       Atrial fib   History of Present Illness:    Fernando Taylor is a 81 y.o. male with a hx of Atrial fib . - s/p ablation  Has hx of HTN, HLD , DM  With peripheral neuropathy,  Has developed an essential tremor .   Has rare episodes of indigestion  Had a stress test in late 2019 and then he moved to [facility] ( from [place])  Has lived in [place] years ago .  Worked in Manufacturing engineer.   Was told to get a nuclear stress  Started back exercising - 1/2 hour of stationary bike No CP,  Also walks around the yard and house .   Recent labs from his primary medical doctor reveals a hemoglobin A1c of 7.1.  Total cholesterol is 174.  The HDL is 63.  The triglyceride level is 234.  The LDL is 73.  TSH is 2.61.  Glucose is 128.  Creatinine is 0.84.  Sodium is 141 potassium is 5.2 liver enzymes are normal.  CBC is normal.  PSA is less than 0.01.   August 17, 2021: Fernando Taylor is seen today for follow-up of his atrial fibrillation. Had screening ABI - non diagnostic  Had duplex   Has occasional episodes of CP ,  lasts for several seconds  Does not occur when he is doing his stationary bike work up  Occurs out of the blue, not with eating    Past Medical History:  Diagnosis Date   Allergies    Diabetes mellitus ([place])    History of prostate cancer 2010   NO RADIATION   History of tremor    NOT HEN RESTING, MOSTLY INTENTION TREMOR AND AT A CERTAIN ANGLE, ABLE TO WRITE. HAS NOT HAD MORE EVALUATION, WAS BEING OBSERVED.   HLD (hyperlipidemia)    Hx of atrial fibrillation without current medication 2014   S/P ABLATION IN [place],    Hypertension    Paroxysmal atrial fibrillation (HCC)    Thrombocytopenia (HCC)    Tremor     Past  Surgical History:  Procedure Laterality Date   ATRIAL FIBRILLATION ABLATION  2014   S/P   COLONOSCOPY     DUE NOW, APRIL 2020   KIDNEY STONE SURGERY     PROSTATECTOMY  2010    Current Medications: Current Meds  Medication Sig   Ascorbic Acid (VITAMIN C WITH ROSE HIPS) 500 MG tablet Take 500 mg by mouth daily. 10 mg rose hip   aspirin EC 81 MG tablet Take 81 mg by mouth daily.   Cholecalciferol (VITAMIN D3) 125 MCG (5000 UT) CAPS Take by mouth.   diphenhydrAMINE HCl (ALLERGY MED PO) Take by mouth daily.   glimepiride (AMARYL) 4 MG tablet Take 4 mg by mouth daily with breakfast. Twice daily   JARDIANCE 10 MG TABS tablet    Krill Oil 500 MG CAPS Take by mouth.   lovastatin (MEVACOR) 10 MG tablet Take 10 mg by mouth at bedtime.   Magnesium 250 MG TABS Take by mouth daily.   metFORMIN (GLUCOPHAGE) 1000 MG tablet    METOPROLOL TARTRATE PO Take 25 mg by mouth 2 (two) times daily.   Multiple Vitamin (  MULTIVITAMIN) tablet Take 1 tablet by mouth daily.   ONETOUCH ULTRA test strip daily. use as directed   ramipril (ALTACE) 2.5 MG capsule Take 2.5 mg by mouth daily.   repaglinide (PRANDIN) 1 MG tablet Take 1 mg by mouth 2 (two) times daily. 1 tablet am,2 tablets  pm   SODIUM FLUORIDE 5000 SENSITIVE 1.1-5 % PSTE BRUSH WITH TWICE DAILY   [DISCONTINUED] Cetirizine HCl 10 MG CAPS Take by mouth.     Allergies:   Patient has no known allergies.   Social History   Socioeconomic History   Marital status: Married    Spouse name: Not on file   Number of children: 2   Years of education: Not on file   Highest education level: Not on file  Occupational History   Not on file  Tobacco Use   Smoking status: Never   Smokeless tobacco: Never  Vaping Use   Vaping Use: Never used  Substance and Sexual Activity   Alcohol use: Never   Drug use: Never   Sexual activity: Not on file  Other Topics Concern   Not on file  Social History Narrative   Lives with wife   Social Determinants of Health    Financial Resource Strain: Not on file  Food Insecurity: Not on file  Transportation Needs: Not on file  Physical Activity: Not on file  Stress: Not on file  Social Connections: Not on file     Family History: The patient's family history includes CAD in his brother and father; Cerebrovascular Accident in his mother; Diabetes in his mother; Pulmonary fibrosis in his sister; Stroke in his brother.  ROS:   Please see the history of present illness.     All other systems reviewed and are negative.  EKGs/Labs/Other Studies Reviewed:    The following studies were reviewed today:   EKG:   Feb. 21, 2023:  NSR at 24.   1st degree AV block    Recent Labs: No results found for requested labs within last 8760 hours.  Recent Lipid Panel No results found for: CHOL, TRIG, HDL, CHOLHDL, VLDL, LDLCALC, LDLDIRECT  Physical Exam:     Physical Exam: Blood pressure 128/76, pulse 66, height 5\' 11"  (1.803 m), weight 195 lb 9.6 oz (88.7 kg), SpO2 99 %.  GEN:  Well nourished, well developed in no acute distress HEENT: Normal NECK: No JVD; No carotid bruits LYMPHATICS: No lymphadenopathy CARDIAC: RRR ,  soft systolic murmur  RESPIRATORY:  Clear to auscultation without rales, wheezing or rhonchi  ABDOMEN: Soft, non-tender, non-distended MUSCULOSKELETAL:  No edema; No deformity  SKIN: Warm and dry NEUROLOGIC:  Alert and oriented x 3   ASSESSMENT:    No diagnosis found. PLAN:       History of atrial fibrillation-status post RF ablation.   Remains in NSR   2.  Mild carotid artery disease:  Stable    3.  Hyperlipidemia: labs from May look good   4.  Hypertension: well controlled   5.  CP :  last for several seconds , does not sound like angina Advised him to call if he has worsening CP    Medication Adjustments/Labs and Tests Ordered: Current medicines are reviewed at length with the patient today.  Concerns regarding medicines are outlined above.  No orders of the  defined types were placed in this encounter.  No orders of the defined types were placed in this encounter.   There are no Patient Instructions on file for this visit.  Signed, Mertie Moores, MD  08/17/2021 3:55 PM    [place]

## 2021-08-17 NOTE — Patient Instructions (Signed)
Medication Instructions:  Your physician recommends that you continue on your current medications as directed. Please refer to the Current Medication list given to you today.  *If you need a refill on your cardiac medications before your next appointment, please call your pharmacy*  Follow-Up: At [facility], you and your health needs are our priority.  As part of our continuing mission to provide you with exceptional heart care, we have created designated Provider Care Teams.  These Care Teams include your primary Cardiologist (physician) and Advanced Practice Providers (APPs -  Physician Assistants and Nurse Practitioners) who all work together to provide you with the care you need, when you need it.  Your next appointment:   1 year(s)  The format for your next appointment:   In Person  Provider:   Chelsea Aus, PA-C, Jari Favre, PA-C, Ronie Spies, PA-C, Robin Searing, NP, Nada Boozer, NP, Jacolyn Reedy, PA-C, Eligha Bridegroom, NP, Tereso Newcomer, PA-C, or Cyndi Bender, NP   :1}

## 2021-09-17 ENCOUNTER — Ambulatory Visit (INDEPENDENT_AMBULATORY_CARE_PROVIDER_SITE_OTHER): Payer: Medicare Other | Admitting: Podiatry

## 2021-09-17 ENCOUNTER — Other Ambulatory Visit: Payer: Self-pay

## 2021-09-17 ENCOUNTER — Encounter: Payer: Self-pay | Admitting: Podiatry

## 2021-09-17 DIAGNOSIS — M2041 Other hammer toe(s) (acquired), right foot: Secondary | ICD-10-CM | POA: Diagnosis not present

## 2021-09-17 DIAGNOSIS — B351 Tinea unguium: Secondary | ICD-10-CM | POA: Diagnosis not present

## 2021-09-17 DIAGNOSIS — E1142 Type 2 diabetes mellitus with diabetic polyneuropathy: Secondary | ICD-10-CM

## 2021-09-17 DIAGNOSIS — M79675 Pain in left toe(s): Secondary | ICD-10-CM

## 2021-09-17 DIAGNOSIS — M79674 Pain in right toe(s): Secondary | ICD-10-CM

## 2021-09-17 DIAGNOSIS — M2042 Other hammer toe(s) (acquired), left foot: Secondary | ICD-10-CM

## 2021-09-17 NOTE — Progress Notes (Signed)
This patient returns to my office for at risk foot care.  This patient requires this care by a professional since this patient will be at risk due to having diabetes neuropathy.  This patient is unable to cut nails himself since the patient cannot reach his nails.These nails are painful walking and wearing shoes.  This patient presents for at risk foot care today. Patient has received his diabetic shoes. ? ?General Appearance  Alert, conversant and in no acute stress. ? ?Vascular  Dorsalis pedis and posterior tibial  pulses are weakly  palpable  bilaterally.  Capillary return is within normal limits  bilaterally. Temperature is within normal limits  bilaterally. ? ?Neurologic  Senn-Weinstein monofilament wire test absent bilaterally. Muscle power diminished bilaterally. ? ?Nails Thick disfigured discolored nails with subungual debris  Hallux nails  B/L. No evidence of bacterial infection or drainage bilaterally. ? ?Orthopedic  No limitations of motion  feet .  No crepitus or effusions noted.  No bony pathology or digital deformities noted.  Mild  HAV  B/L. ? ?Skin  normotropic skin with no porokeratosis noted bilaterally.  No signs of infections or ulcers noted.    ? ?Onychomycosis  Pain in right toes  Pain in left toes ? ?Consent was obtained for treatment procedures.   Mechanical debridement of nails 1-5  bilaterally performed with a nail nipper.  Filed with dremel without incident.   ? ? ?Return office visit   3   months                  Told patient to return for periodic foot care and evaluation due to potential at risk complications. ? ? ?Gardiner Barefoot DPM  ?

## 2021-12-20 ENCOUNTER — Ambulatory Visit (INDEPENDENT_AMBULATORY_CARE_PROVIDER_SITE_OTHER): Payer: Medicare Other | Admitting: Podiatry

## 2021-12-20 ENCOUNTER — Encounter: Payer: Self-pay | Admitting: Podiatry

## 2021-12-20 DIAGNOSIS — M2041 Other hammer toe(s) (acquired), right foot: Secondary | ICD-10-CM

## 2021-12-20 DIAGNOSIS — B351 Tinea unguium: Secondary | ICD-10-CM

## 2021-12-20 DIAGNOSIS — M79674 Pain in right toe(s): Secondary | ICD-10-CM

## 2021-12-20 DIAGNOSIS — E1142 Type 2 diabetes mellitus with diabetic polyneuropathy: Secondary | ICD-10-CM

## 2021-12-20 DIAGNOSIS — M79675 Pain in left toe(s): Secondary | ICD-10-CM | POA: Diagnosis not present

## 2021-12-20 DIAGNOSIS — M2042 Other hammer toe(s) (acquired), left foot: Secondary | ICD-10-CM

## 2021-12-20 NOTE — Progress Notes (Signed)
This patient returns to my office for at risk foot care.  This patient requires this care by a professional since this patient will be at risk due to having diabetes neuropathy.  This patient is unable to cut nails himself since the patient cannot reach his nails.These nails are painful walking and wearing shoes.  This patient presents for at risk foot care today. Patient has received his diabetic shoes.  General Appearance  Alert, conversant and in no acute stress.  Vascular  Dorsalis pedis and posterior tibial  pulses are weakly  palpable  bilaterally.  Capillary return is within normal limits  bilaterally. Temperature is within normal limits  bilaterally.  Neurologic  Senn-Weinstein monofilament wire test absent bilaterally. Muscle power diminished bilaterally.  Nails Thick disfigured discolored nails with subungual debris  Hallux nails  B/L. No evidence of bacterial infection or drainage bilaterally.  Orthopedic  No limitations of motion  feet .  No crepitus or effusions noted.  No bony pathology or digital deformities noted.  Mild  HAV  B/L. Hammer toes  B/L.  Skin  normotropic skin with no porokeratosis noted bilaterally.  No signs of infections or ulcers noted.     Onychomycosis  Pain in right toes  Pain in left toes  Consent was obtained for treatment procedures.   Mechanical debridement of nails 1-5  bilaterally performed with a nail nipper.  Filed with dremel without incident.     Return office visit   3   months                  Told patient to return for periodic foot care and evaluation due to potential at risk complications.   Gardiner Barefoot DPM

## 2022-03-14 ENCOUNTER — Ambulatory Visit (INDEPENDENT_AMBULATORY_CARE_PROVIDER_SITE_OTHER): Payer: Medicare Other | Admitting: Podiatry

## 2022-03-14 ENCOUNTER — Encounter: Payer: Self-pay | Admitting: Podiatry

## 2022-03-14 DIAGNOSIS — M79674 Pain in right toe(s): Secondary | ICD-10-CM

## 2022-03-14 DIAGNOSIS — M2041 Other hammer toe(s) (acquired), right foot: Secondary | ICD-10-CM

## 2022-03-14 DIAGNOSIS — E1142 Type 2 diabetes mellitus with diabetic polyneuropathy: Secondary | ICD-10-CM | POA: Diagnosis not present

## 2022-03-14 DIAGNOSIS — M79675 Pain in left toe(s): Secondary | ICD-10-CM | POA: Diagnosis not present

## 2022-03-14 DIAGNOSIS — B351 Tinea unguium: Secondary | ICD-10-CM

## 2022-03-14 NOTE — Progress Notes (Signed)
This patient returns to my office for at risk foot care.  This patient requires this care by a professional since this patient will be at risk due to having diabetes neuropathy.  This patient is unable to cut nails himself since the patient cannot reach his nails.These nails are painful walking and wearing shoes.  This patient presents for at risk foot care today. Patient has received his diabetic shoes.  General Appearance  Alert, conversant and in no acute stress.  Vascular  Dorsalis pedis and posterior tibial  pulses are weakly  palpable  bilaterally.  Capillary return is within normal limits  bilaterally. Temperature is within normal limits  bilaterally.  Neurologic  Senn-Weinstein monofilament wire test absent bilaterally. Muscle power diminished bilaterally.  Nails Thick disfigured discolored nails with subungual debris  Hallux nails  B/L. No evidence of bacterial infection or drainage bilaterally.  Orthopedic  No limitations of motion  feet .  No crepitus or effusions noted.  No bony pathology or digital deformities noted.  Mild  HAV  B/L. Hammer toes  B/L.  Skin  normotropic skin with no porokeratosis noted bilaterally.  No signs of infections or ulcers noted.     Onychomycosis  Pain in right toes  Pain in left toes  Consent was obtained for treatment procedures.   Mechanical debridement of nails 1-5  bilaterally performed with a nail nipper.  Filed with dremel without incident.     Return office visit   3   months                  Told patient to return for periodic foot care and evaluation due to potential at risk complications.   Gardiner Barefoot DPM

## 2022-06-15 ENCOUNTER — Ambulatory Visit: Payer: Medicare Other | Admitting: Podiatry

## 2022-06-22 ENCOUNTER — Ambulatory Visit (INDEPENDENT_AMBULATORY_CARE_PROVIDER_SITE_OTHER): Payer: Medicare Other | Admitting: Podiatry

## 2022-06-22 ENCOUNTER — Encounter: Payer: Self-pay | Admitting: Podiatry

## 2022-06-22 DIAGNOSIS — E1142 Type 2 diabetes mellitus with diabetic polyneuropathy: Secondary | ICD-10-CM

## 2022-06-22 DIAGNOSIS — M79675 Pain in left toe(s): Secondary | ICD-10-CM

## 2022-06-22 DIAGNOSIS — M2041 Other hammer toe(s) (acquired), right foot: Secondary | ICD-10-CM

## 2022-06-22 DIAGNOSIS — B351 Tinea unguium: Secondary | ICD-10-CM | POA: Diagnosis not present

## 2022-06-22 DIAGNOSIS — M2042 Other hammer toe(s) (acquired), left foot: Secondary | ICD-10-CM

## 2022-06-22 DIAGNOSIS — M79674 Pain in right toe(s): Secondary | ICD-10-CM

## 2022-06-22 NOTE — Progress Notes (Signed)
This patient returns to my office for at risk foot care.  This patient requires this care by a professional since this patient will be at risk due to having diabetes neuropathy.  This patient is unable to cut nails himself since the patient cannot reach his nails.These nails are painful walking and wearing shoes.  This patient presents for at risk foot care today. Patient has received his diabetic shoes.  General Appearance  Alert, conversant and in no acute stress.  Vascular  Dorsalis pedis and posterior tibial  pulses are weakly  palpable  bilaterally.  Capillary return is within normal limits  bilaterally. Temperature is within normal limits  bilaterally.  Neurologic  Senn-Weinstein monofilament wire test absent bilaterally. Muscle power diminished bilaterally.  Nails Thick disfigured discolored nails with subungual debris  Hallux nails  B/L. No evidence of bacterial infection or drainage bilaterally.  Orthopedic  No limitations of motion  feet .  No crepitus or effusions noted.  No bony pathology or digital deformities noted.  Mild  HAV  B/L. Hammer toes  B/L.  Skin  normotropic skin with no porokeratosis noted bilaterally.  No signs of infections or ulcers noted.     Onychomycosis  Pain in right toes  Pain in left toes  Consent was obtained for treatment procedures.   Mechanical debridement of nails 1-5  bilaterally performed with a nail nipper.  Filed with dremel without incident.  Patient is asking for diabetic shoes in 2024.   Return office visit   3   months                  Told patient to return for periodic foot care and evaluation due to potential at risk complications.   Lain Tetterton DPM  

## 2022-08-18 ENCOUNTER — Encounter: Payer: Self-pay | Admitting: Cardiovascular Disease

## 2022-08-18 NOTE — Progress Notes (Signed)
Cardiology Office Note:    Date:  08/19/2022   ID:  Fernando Taylor, DOB 06/19/1941, MRN [id number]  PCP:  Lujean Amel, MD  Cardiologist:  None  Electrophysiologist:  None   Referring MD: Lujean Amel, MD   Chief Complaint  Patient presents with   Atrial Fibrillation       Atrial fib   History of Present Illness:    Fernando Taylor is a 82 y.o. male with a hx of Atrial fib . - s/p ablation  Has hx of HTN, HLD , DM  With peripheral neuropathy,  Has developed an essential tremor .   Has rare episodes of indigestion  Had a stress test in late 2019 and then he moved to [facility] ( from [place])  Has lived in [place] years ago .  Worked in Manufacturing engineer.   Was told to get a nuclear stress  Started back exercising - 1/2 hour of stationary bike No CP,  Also walks around the yard and house .   Recent labs from his primary medical doctor reveals a hemoglobin A1c of 7.1.  Total cholesterol is 174.  The HDL is 63.  The triglyceride level is 234.  The LDL is 73.  TSH is 2.61.  Glucose is 128.  Creatinine is 0.84.  Sodium is 141 potassium is 5.2 liver enzymes are normal.  CBC is normal.  PSA is less than 0.01.   August 17, 2021: Fernando Taylor is seen today for follow-up of his atrial fibrillation. Had screening ABI - non diagnostic  Had duplex   Has occasional episodes of CP ,  lasts for several seconds  Does not occur when he is doing his stationary bike work up  [place] out of the blue, not with eating    Feb. 23, 2024 Fernando Taylor is seen today for follow up of his atrial fib  Hx of Afib ablation  Has maintained NSR .  Uses his Jodelle Red mobile regularly  Is on ASA 81 mg a day  Rides stationary bike 6-7 miles a day  Walks , develops heavy legs with walking  No cramps   Had lower extremity ABI Non compressible / Non diagnostic but likely due to diffuse PAD      Past Medical History:  Diagnosis Date   Allergies    Diabetes mellitus ([place])    History of prostate cancer 2010   NO RADIATION    History of tremor    NOT HEN RESTING, MOSTLY INTENTION TREMOR AND AT A CERTAIN ANGLE, ABLE TO WRITE. HAS NOT HAD MORE EVALUATION, WAS BEING OBSERVED.   HLD (hyperlipidemia)    Hx of atrial fibrillation without current medication 2014   S/P ABLATION IN [place],    Hypertension    Paroxysmal atrial fibrillation (HCC)    Thrombocytopenia (HCC)    Tremor     Past Surgical History:  Procedure Laterality Date   ATRIAL FIBRILLATION ABLATION  2014   S/P   COLONOSCOPY     DUE NOW, APRIL 2020   KIDNEY STONE SURGERY     PROSTATECTOMY  2010    Current Medications: Current Meds  Medication Sig   Ascorbic Acid (VITAMIN C WITH ROSE HIPS) 500 MG tablet Take 500 mg by mouth daily. 10 mg rose hip   aspirin EC 81 MG tablet Take 81 mg by mouth daily.   Cholecalciferol (VITAMIN D3) 125 MCG (5000 UT) CAPS Take by mouth.   glimepiride (AMARYL) 4 MG tablet Take 4 mg by mouth daily with breakfast. Twice daily  JARDIANCE 25 MG TABS tablet Take 25 mg by mouth daily.   Krill Oil 500 MG CAPS Take by mouth.   loratadine (CLARITIN) 10 MG tablet 1 tablet   lovastatin (MEVACOR) 10 MG tablet Take 10 mg by mouth at bedtime.   Magnesium 250 MG TABS Take by mouth daily.   metFORMIN (GLUCOPHAGE) 1000 MG tablet Take 1,000 mg by mouth 2 (two) times daily with a meal.   METOPROLOL TARTRATE PO Take 25 mg by mouth 2 (two) times daily.   Multiple Vitamin (MULTIVITAMIN) tablet Take 1 tablet by mouth daily.   ONETOUCH ULTRA test strip daily. use as directed   ramipril (ALTACE) 2.5 MG capsule Take 2.5 mg by mouth daily.   repaglinide (PRANDIN) 1 MG tablet Take 1 mg by mouth 2 (two) times daily. 1 tablet am,2 tablets  pm   SODIUM FLUORIDE 5000 SENSITIVE 1.1-5 % PSTE BRUSH WITH TWICE DAILY     Allergies:   Patient has no known allergies.   Social History   Socioeconomic History   Marital status: Married    Spouse name: Not on file   Number of children: 2   Years of education: Not on file   Highest education level: Not  on file  Occupational History   Not on file  Tobacco Use   Smoking status: Never   Smokeless tobacco: Never  Vaping Use   Vaping Use: Never used  Substance and Sexual Activity   Alcohol use: Never   Drug use: Never   Sexual activity: Not on file  Other Topics Concern   Not on file  Social History Narrative   Lives with wife   Social Determinants of Health   Financial Resource Strain: Not on file  Food Insecurity: Not on file  Transportation Needs: Not on file  Physical Activity: Not on file  Stress: Not on file  Social Connections: Not on file     Family History: The patient's family history includes CAD in his brother and father; Cerebrovascular Accident in his mother; Diabetes in his mother; Pulmonary fibrosis in his sister; Stroke in his brother.  ROS:   Please see the history of present illness.     All other systems reviewed and are negative.  EKGs/Labs/Other Studies Reviewed:    The following studies were reviewed today:   EKG:   August 19, 2022: Normal sinus rhythm at 66 with a first-degree AV block.  Voltage criteria for LVH.  Inferior Q waves.  No changes from previous ECG    Recent Labs: No results found for requested labs within last 365 days.  Recent Lipid Panel No results found for: "CHOL", "TRIG", "HDL", "CHOLHDL", "VLDL", "LDLCALC", "LDLDIRECT"  Physical Exam:     Physical Exam: Blood pressure 118/66, pulse 66, height '5\' 11"'$  (1.803 m), weight 198 lb 6.4 oz (90 kg), SpO2 97 %.       GEN:  Well nourished, well developed in no acute distress HEENT: Normal NECK: No JVD; No carotid bruits LYMPHATICS: No lymphadenopathy CARDIAC: RRR , no murmurs, rubs, gallops RESPIRATORY:  Clear to auscultation without rales, wheezing or rhonchi  ABDOMEN: Soft, non-tender, non-distended MUSCULOSKELETAL:  No edema; No deformity  SKIN: Warm and dry NEUROLOGIC:  Alert and oriented x 3   ASSESSMENT:    1. Peripheral arterial disease ([place])   2. Mixed  hyperlipidemia   3. Nonspecific abnormal electrocardiogram (ECG) (EKG)    PLAN:       History of atrial fibrillation-status post RF ablation.  Has maintained NSR .    2.  Mild carotid artery disease:      3.  Hyperlipidemia:  lipids look good   4.  Hypertension:  BP is well controlled.   5.  ? Old Inf MI :  ECG is unchanged but does show inf Q waves.   He does not recall every having any symptoms  Will get an echo to assess LV function    Medication Adjustments/Labs and Tests Ordered: Current medicines are reviewed at length with the patient today.  Concerns regarding medicines are outlined above.  Orders Placed This Encounter  Procedures   Ambulatory Referral for Peripheral Vascular Consult   EKG 12-Lead   No orders of the defined types were placed in this encounter.   Patient Instructions  Medication Instructions:  Your physician recommends that you continue on your current medications as directed. Please refer to the Current Medication list given to you today.  *If you need a refill on your cardiac medications before your next appointment, please call your pharmacy*   Lab Work: NONE If you have labs (blood work) drawn today and your tests are completely normal, you will receive your results only by: [place] (if you have MyChart) OR A paper copy in the mail If you have any lab test that is abnormal or we need to change your treatment, we will call you to review the results.   Testing/Procedures: Consult for Peripheral Vascular Appointment (Berry/Arida)   Follow-Up: At [facility], you and your health needs are our priority.  As part of our continuing mission to provide you with exceptional heart care, we have created designated Provider Care Teams.  These Care Teams include your primary Cardiologist (physician) and Advanced Practice Providers (APPs -  Physician Assistants and Nurse Practitioners) who all work together to provide you with the  care you need, when you need it.  We recommend signing up for the patient portal called "MyChart".  Sign up information is provided on this After Visit Summary.  MyChart is used to connect with patients for Virtual Visits (Telemedicine).  Patients are able to view lab/test results, encounter notes, upcoming appointments, etc.  Non-urgent messages can be sent to your provider as well.   To learn more about what you can do with MyChart, go to [email or web address].    Your next appointment:   1 year(s)  Provider:   Mertie Moores, MD     Signed, Mertie Moores, MD  08/19/2022 2:46 PM    [place]

## 2022-08-19 ENCOUNTER — Encounter: Payer: Self-pay | Admitting: Cardiovascular Disease

## 2022-08-19 ENCOUNTER — Ambulatory Visit: Payer: Medicare Other | Attending: Cardiovascular Disease | Admitting: Cardiovascular Disease

## 2022-08-19 VITALS — BP 118/66 | HR 66 | Ht 71.0 in | Wt 198.4 lb

## 2022-08-19 DIAGNOSIS — R9431 Abnormal electrocardiogram [ECG] [EKG]: Secondary | ICD-10-CM | POA: Diagnosis not present

## 2022-08-19 DIAGNOSIS — E782 Mixed hyperlipidemia: Secondary | ICD-10-CM | POA: Diagnosis not present

## 2022-08-19 DIAGNOSIS — I739 Peripheral vascular disease, unspecified: Secondary | ICD-10-CM | POA: Diagnosis not present

## 2022-08-19 NOTE — Patient Instructions (Addendum)
Medication Instructions:  Your physician recommends that you continue on your current medications as directed. Please refer to the Current Medication list given to you today.  *If you need a refill on your cardiac medications before your next appointment, please call your pharmacy*   Lab Work: NONE If you have labs (blood work) drawn today and your tests are completely normal, you will receive your results only by: [place] (if you have MyChart) OR A paper copy in the mail If you have any lab test that is abnormal or we need to change your treatment, we will call you to review the results.   Testing/Procedures: Consult for Peripheral Vascular Appointment (Berry/Arida)  ECHO Your physician has requested that you have an echocardiogram. Echocardiography is a painless test that uses sound waves to create images of your heart. It provides your doctor with information about the size and shape of your heart and how well your heart's chambers and valves are working. This procedure takes approximately one hour. There are no restrictions for this procedure. Please do NOT wear cologne, perfume, aftershave, or lotions (deodorant is allowed). Please arrive 15 minutes prior to your appointment time.    Follow-Up: At [facility], you and your health needs are our priority.  As part of our continuing mission to provide you with exceptional heart care, we have created designated Provider Care Teams.  These Care Teams include your primary Cardiologist (physician) and Advanced Practice Providers (APPs -  Physician Assistants and Nurse Practitioners) who all work together to provide you with the care you need, when you need it.  We recommend signing up for the patient portal called "MyChart".  Sign up information is provided on this After Visit Summary.  MyChart is used to connect with patients for Virtual Visits (Telemedicine).  Patients are able to view lab/test results, encounter notes,  upcoming appointments, etc.  Non-urgent messages can be sent to your provider as well.   To learn more about what you can do with MyChart, go to [email or web address].    Your next appointment:   1 year(s)  Provider:   Mertie Moores, MD

## 2022-09-09 ENCOUNTER — Ambulatory Visit: Payer: Medicare Other | Attending: Cardiovascular Disease | Admitting: Cardiovascular Disease

## 2022-09-09 ENCOUNTER — Encounter: Payer: Self-pay | Admitting: Cardiovascular Disease

## 2022-09-09 VITALS — BP 132/70 | HR 63 | Ht 70.5 in | Wt 194.2 lb

## 2022-09-09 DIAGNOSIS — I739 Peripheral vascular disease, unspecified: Secondary | ICD-10-CM | POA: Diagnosis present

## 2022-09-09 NOTE — Patient Instructions (Signed)
Medication Instructions:  Your physician recommends that you continue on your current medications as directed. Please refer to the Current Medication list given to you today.  *If you need a refill on your cardiac medications before your next appointment, please call your pharmacy*   Testing/Procedures: Your physician has requested that you have a lower extremity arterial duplex. During this test, ultrasound is used to evaluate arterial blood flow in the legs. Allow one hour for this exam. There are no restrictions or special instructions. This will take place at [address], [address].  Your physician has requested that you have an ankle brachial index (ABI). During this test an ultrasound and blood pressure cuff are used to evaluate the arteries that supply the arms and legs with blood. Allow thirty minutes for this exam. There are no restrictions or special instructions. This will take place at [address], [address].     Follow-Up: At [facility], you and your health needs are our priority.  As part of our continuing mission to provide you with exceptional heart care, we have created designated Provider Care Teams.  These Care Teams include your primary Cardiologist (physician) and Advanced Practice Providers (APPs -  Physician Assistants and Nurse Practitioners) who all work together to provide you with the care you need, when you need it.  We recommend signing up for the patient portal called "MyChart".  Sign up information is provided on this After Visit Summary.  MyChart is used to connect with patients for Virtual Visits (Telemedicine).  Patients are able to view lab/test results, encounter notes, upcoming appointments, etc.  Non-urgent messages can be sent to your provider as well.   To learn more about what you can do with MyChart, go to [email or web address].    Your next appointment:   We will see you on an as needed basis.   Provider:   Jonathan Berry,  MD  

## 2022-09-09 NOTE — Progress Notes (Signed)
09/09/2022 Fernando Taylor   [birth year]  [id number]  Primary Physician Koirala, Dibas, MD Primary Cardiologist: Fernando Harp MD Fernando Taylor  HPI:  Fernando Taylor is a 82 y.o. thin-appearing married Caucasian male father of 53, grandfather of 4 grandchildren referred by Dr. Acie Taylor for peripheral vascular valuation because of abnormal ABIs.  He is retired from working in the Museum/gallery conservator.  He relocated from [place] to [place] in 2019 just before the pandemic to be closer to family.  His risk factors include treated diabetes, hypertension and hyperlipidemia.  He had an A-fib ablation in the past.  There is no family history of heart disease Fernando Taylor is never had a heart attack or stroke.  He denies chest pain or shortness of breath.  He rides a stationary bike 6 miles a day.  He does have diabetic peripheral neuropathy.  He had Doppler studies performed 08/12/2021 revealing noncompressible ABIs with triphasic waveforms.   Current Meds  Medication Sig   Ascorbic Acid (VITAMIN C WITH ROSE HIPS) 500 MG tablet Take 500 mg by mouth daily. 10 mg rose hip   aspirin EC 81 MG tablet Take 81 mg by mouth daily.   Calcium Carbonate-Vit D-Min (CALCIUM 1200) 1200-1000 MG-UNIT CHEW    Cholecalciferol (VITAMIN D3) 125 MCG (5000 UT) CAPS Take by mouth.   glimepiride (AMARYL) 4 MG tablet Take 4 mg by mouth daily with breakfast. Twice daily   JARDIANCE 25 MG TABS tablet Take 25 mg by mouth daily.   Krill Oil 500 MG CAPS Take by mouth.   loratadine (CLARITIN) 10 MG tablet 1 tablet   lovastatin (MEVACOR) 10 MG tablet Take 10 mg by mouth at bedtime.   Magnesium 250 MG TABS Take by mouth daily.   metFORMIN (GLUCOPHAGE) 1000 MG tablet Take 1,000 mg by mouth 2 (two) times daily with a meal.   METOPROLOL TARTRATE PO Take 25 mg by mouth 2 (two) times daily.   Multiple Vitamin (MULTIVITAMIN) tablet Take 1 tablet by mouth daily.   ONETOUCH ULTRA test strip daily. use as directed    ramipril (ALTACE) 2.5 MG capsule Take 2.5 mg by mouth daily.   repaglinide (PRANDIN) 1 MG tablet Take 1 mg by mouth 2 (two) times daily. 1 tablet am,2 tablets  pm   SODIUM FLUORIDE 5000 SENSITIVE 1.1-5 % PSTE BRUSH WITH TWICE DAILY     No Known Allergies  Social History   Socioeconomic History   Marital status: Married    Spouse name: Not on file   Number of children: 2   Years of education: Not on file   Highest education level: Not on file  Occupational History   Not on file  Tobacco Use   Smoking status: Never   Smokeless tobacco: Never  Vaping Use   Vaping Use: Never used  Substance and Sexual Activity   Alcohol use: Never   Drug use: Never   Sexual activity: Not on file  Other Topics Concern   Not on file  Social History Narrative   Lives with wife   Social Determinants of Health   Financial Resource Strain: Not on file  Food Insecurity: Not on file  Transportation Needs: Not on file  Physical Activity: Not on file  Stress: Not on file  Social Connections: Not on file  Intimate Partner Violence: Not on file     Review of Systems: General: negative for chills, fever, night sweats or weight changes.  Cardiovascular: negative for chest  pain, dyspnea on exertion, edema, orthopnea, palpitations, paroxysmal nocturnal dyspnea or shortness of breath Dermatological: negative for rash Respiratory: negative for cough or wheezing Urologic: negative for hematuria Abdominal: negative for nausea, vomiting, diarrhea, bright red blood per rectum, melena, or hematemesis Neurologic: negative for visual changes, syncope, or dizziness All other systems reviewed and are otherwise negative except as noted above.    Blood pressure 132/70, pulse 63, height 5' 10.5" (1.791 m), weight 194 lb 3.2 oz (88.1 kg), SpO2 97 %.  General appearance: alert and no distress Neck: no adenopathy, no carotid bruit, no JVD, supple, symmetrical, trachea midline, and thyroid not enlarged,  symmetric, no tenderness/mass/nodules Lungs: clear to auscultation bilaterally Heart: regular rate and rhythm, S1, S2 normal, no murmur, click, rub or gallop Extremities: extremities normal, atraumatic, no cyanosis or edema Pulses: Absent pedal pulses Skin: Skin color, texture, turgor normal. No rashes or lesions Neurologic: Grossly normal  EKG not performed today  ASSESSMENT AND PLAN:   Peripheral arterial disease ([place]) Mr. Fernando Taylor was referred to me by Dr. Acie Taylor for evaluation of PAD.  He has had a long history of diabetes as well as hypertension and hyperlipidemia.  There is no history of ischemic heart disease.  He has diabetic peripheral neuropathy.  He does get some "heaviness" when he walks but it is not lifestyle limiting.  He did have Doppler studies performed 08/12/2021 which revealed noncompressible ABIs with triphasic waveforms.  He does not have palpable pedal pulses.  I am going to get lower extremity arterial Doppler studies to further evaluate.     Fernando Harp MD FACP,FACC,FAHA, [facility] 09/09/2022 2:39 PM

## 2022-09-09 NOTE — Assessment & Plan Note (Signed)
Mr. Bicknese was referred to me by Dr. Acie Fredrickson for evaluation of PAD.  He has had a long history of diabetes as well as hypertension and hyperlipidemia.  There is no history of ischemic heart disease.  He has diabetic peripheral neuropathy.  He does get some "heaviness" when he walks but it is not lifestyle limiting.  He did have Doppler studies performed 08/12/2021 which revealed noncompressible ABIs with triphasic waveforms.  He does not have palpable pedal pulses.  I am going to get lower extremity arterial Doppler studies to further evaluate.

## 2022-09-19 ENCOUNTER — Ambulatory Visit (HOSPITAL_COMMUNITY): Payer: Medicare Other | Attending: Cardiology

## 2022-09-19 DIAGNOSIS — R9431 Abnormal electrocardiogram [ECG] [EKG]: Secondary | ICD-10-CM | POA: Insufficient documentation

## 2022-09-19 LAB — ECHOCARDIOGRAM COMPLETE
Area-P 1/2: 4.06 cm2
MV M vel: 4.78 m/s
MV Peak grad: 91.4 mmHg
P 1/2 time: 611 msec
S' Lateral: 2.9 cm

## 2022-09-21 ENCOUNTER — Ambulatory Visit: Payer: Medicare Other

## 2022-09-21 ENCOUNTER — Encounter: Payer: Self-pay | Admitting: Podiatry

## 2022-09-21 ENCOUNTER — Ambulatory Visit (INDEPENDENT_AMBULATORY_CARE_PROVIDER_SITE_OTHER): Payer: Medicare Other | Admitting: Podiatry

## 2022-09-21 VITALS — BP 142/63 | HR 77

## 2022-09-21 DIAGNOSIS — M79675 Pain in left toe(s): Secondary | ICD-10-CM

## 2022-09-21 DIAGNOSIS — M2042 Other hammer toe(s) (acquired), left foot: Secondary | ICD-10-CM

## 2022-09-21 DIAGNOSIS — E1142 Type 2 diabetes mellitus with diabetic polyneuropathy: Secondary | ICD-10-CM

## 2022-09-21 DIAGNOSIS — B351 Tinea unguium: Secondary | ICD-10-CM | POA: Diagnosis not present

## 2022-09-21 DIAGNOSIS — M79674 Pain in right toe(s): Secondary | ICD-10-CM

## 2022-09-21 DIAGNOSIS — M2041 Other hammer toe(s) (acquired), right foot: Secondary | ICD-10-CM

## 2022-09-21 DIAGNOSIS — M2010 Hallux valgus (acquired), unspecified foot: Secondary | ICD-10-CM

## 2022-09-21 NOTE — Progress Notes (Signed)
This patient returns to my office for at risk foot care.  This patient requires this care by a professional since this patient will be at risk due to having diabetes neuropathy.  This patient is unable to cut nails himself since the patient cannot reach his nails.These nails are painful walking and wearing shoes.  This patient presents for at risk foot care today. Patient has received his diabetic shoes.  General Appearance  Alert, conversant and in no acute stress.  Vascular  Dorsalis pedis and posterior tibial  pulses are weakly  palpable  bilaterally.  Capillary return is within normal limits  bilaterally. Temperature is within normal limits  bilaterally.  Neurologic  Senn-Weinstein monofilament wire test absent bilaterally. Muscle power diminished bilaterally.  Nails Thick disfigured discolored nails with subungual debris  Hallux nails  B/L. No evidence of bacterial infection or drainage bilaterally.  Orthopedic  No limitations of motion  feet .  No crepitus or effusions noted.  No bony pathology or digital deformities noted.  Mild  HAV  B/L. Hammer toes  B/L.  Skin  normotropic skin with no porokeratosis noted bilaterally.  No signs of infections or ulcers noted.     Onychomycosis  Pain in right toes  Pain in left toes  Consent was obtained for treatment procedures.   Mechanical debridement of nails 1-5  bilaterally performed with a nail nipper.  Filed with dremel without incident.  Patient is asking for diabetic shoes in 2024.   Return office visit   3   months                  Told patient to return for periodic foot care and evaluation due to potential at risk complications.   Gardiner Barefoot DPM

## 2022-09-28 ENCOUNTER — Ambulatory Visit (HOSPITAL_COMMUNITY)
Admission: RE | Admit: 2022-09-28 | Discharge: 2022-09-28 | Disposition: A | Discharge status: Home / Self Care | Payer: Medicare Other | Source: Ambulatory Visit | Attending: Cardiovascular Disease | Admitting: Cardiovascular Disease

## 2022-09-28 DIAGNOSIS — I739 Peripheral vascular disease, unspecified: Secondary | ICD-10-CM | POA: Diagnosis not present

## 2022-09-29 LAB — VAS US ABI WITH/WO TBI

## 2022-09-29 NOTE — Addendum Note (Signed)
Addended by: Gardiner Barefoot on: 09/29/2022 12:13 PM   Modules accepted: Orders

## 2022-10-21 NOTE — Progress Notes (Signed)
Patient casted for diabetic shoes by Christan   Shoes [id number] Size 68M   Treating physician is Dr Docia Chuck

## 2022-12-12 ENCOUNTER — Telehealth: Payer: Self-pay | Admitting: Podiatry

## 2022-12-12 NOTE — Telephone Encounter (Signed)
Pt called to inquire about orthotics, wanted to know an update on them

## 2022-12-22 ENCOUNTER — Encounter: Payer: Self-pay | Admitting: Podiatry

## 2022-12-22 ENCOUNTER — Ambulatory Visit (INDEPENDENT_AMBULATORY_CARE_PROVIDER_SITE_OTHER): Payer: Medicare Other | Admitting: Podiatry

## 2022-12-22 DIAGNOSIS — E1142 Type 2 diabetes mellitus with diabetic polyneuropathy: Secondary | ICD-10-CM

## 2022-12-22 DIAGNOSIS — B351 Tinea unguium: Secondary | ICD-10-CM

## 2022-12-22 DIAGNOSIS — M79674 Pain in right toe(s): Secondary | ICD-10-CM | POA: Diagnosis not present

## 2022-12-22 DIAGNOSIS — M79675 Pain in left toe(s): Secondary | ICD-10-CM | POA: Diagnosis not present

## 2022-12-22 NOTE — Progress Notes (Signed)
This patient returns to my office for at risk foot care.  This patient requires this care by a professional since this patient will be at risk due to having diabetes neuropathy.  This patient is unable to cut nails himself since the patient cannot reach his nails.These nails are painful walking and wearing shoes.  This patient presents for at risk foot care today. Patient has received his diabetic shoes.  General Appearance  Alert, conversant and in no acute stress.  Vascular  Dorsalis pedis and posterior tibial  pulses are weakly  palpable  bilaterally.  Capillary return is within normal limits  bilaterally. Temperature is within normal limits  bilaterally.  Neurologic  Senn-Weinstein monofilament wire test absent bilaterally. Muscle power diminished bilaterally.  Nails Thick disfigured discolored nails with subungual debris  Hallux nails  B/L. No evidence of bacterial infection or drainage bilaterally.  Orthopedic  No limitations of motion  feet .  No crepitus or effusions noted.  No bony pathology or digital deformities noted.  Mild  HAV  B/L. Hammer toes  B/L.  Skin  normotropic skin with no porokeratosis noted bilaterally.  No signs of infections or ulcers noted.     Onychomycosis  Pain in right toes  Pain in left toes  Consent was obtained for treatment procedures.   Mechanical debridement of nails 1-5  bilaterally performed with a nail nipper.  Filed with dremel without incident.  Patient is asking for diabetic shoes in 2024.   Return office visit   3   months                  Told patient to return for periodic foot care and evaluation due to potential at risk complications.   Zaidyn Claire DPM  

## 2023-01-10 ENCOUNTER — Telehealth: Payer: Self-pay | Admitting: Podiatry

## 2023-01-10 NOTE — Telephone Encounter (Signed)
Pt left message today at 1158am checking status of orthotics.  Upon checking it was diabetic shoes and inserts not orthotics.   Lvm for pt to call to discuss further but the delay is that last visit his pcp(Dr Docia Chuck) was in January and per medicare policy it has to be within 6 months.

## 2023-02-16 ENCOUNTER — Ambulatory Visit (INDEPENDENT_AMBULATORY_CARE_PROVIDER_SITE_OTHER): Payer: Medicare Other | Admitting: Podiatry

## 2023-02-16 ENCOUNTER — Encounter: Payer: Self-pay | Admitting: Podiatry

## 2023-02-16 DIAGNOSIS — M79675 Pain in left toe(s): Secondary | ICD-10-CM

## 2023-02-16 DIAGNOSIS — M79674 Pain in right toe(s): Secondary | ICD-10-CM

## 2023-02-16 DIAGNOSIS — E1142 Type 2 diabetes mellitus with diabetic polyneuropathy: Secondary | ICD-10-CM

## 2023-02-16 DIAGNOSIS — B351 Tinea unguium: Secondary | ICD-10-CM | POA: Diagnosis not present

## 2023-02-16 NOTE — Progress Notes (Signed)
This patient returns to my office for at risk foot care.  This patient requires this care by a professional since this patient will be at risk due to having diabetes neuropathy.  This patient is unable to cut nails himself since the patient cannot reach his nails.These nails are painful walking and wearing shoes.  This patient presents for at risk foot care today. Patient has received his diabetic shoes.  General Appearance  Alert, conversant and in no acute stress.  Vascular  Dorsalis pedis and posterior tibial  pulses are weakly  palpable  bilaterally.  Capillary return is within normal limits  bilaterally. Temperature is within normal limits  bilaterally.  Neurologic  Senn-Weinstein monofilament wire test absent bilaterally. Muscle power diminished bilaterally.  Nails Thick disfigured discolored nails with subungual debris  Hallux nails  B/L. No evidence of bacterial infection or drainage bilaterally.  Orthopedic  No limitations of motion  feet .  No crepitus or effusions noted.  No bony pathology or digital deformities noted.  Mild  HAV  B/L. Hammer toes  B/L.  Skin  normotropic skin with no porokeratosis noted bilaterally.  No signs of infections or ulcers noted.     Onychomycosis  Pain in right toes  Pain in left toes  Consent was obtained for treatment procedures.   Mechanical debridement of nails 1-5  bilaterally performed with a nail nipper.  Filed with dremel without incident.  Patient is asking for diabetic shoes in 2024.   Return office visit   3   months                  Told patient to return for periodic foot care and evaluation due to potential at risk complications.   Gregory Mayer DPM  

## 2023-03-27 ENCOUNTER — Ambulatory Visit: Payer: Medicare Other

## 2023-03-27 NOTE — Progress Notes (Signed)
Reordered different shoe shoes were too tight and short Fernando Taylor

## 2023-04-14 ENCOUNTER — Telehealth: Payer: Self-pay

## 2023-04-14 NOTE — Telephone Encounter (Signed)
Patient called on status of refurbished orthotics

## 2023-05-10 ENCOUNTER — Ambulatory Visit (INDEPENDENT_AMBULATORY_CARE_PROVIDER_SITE_OTHER): Payer: Medicare Other

## 2023-05-10 DIAGNOSIS — M2041 Other hammer toe(s) (acquired), right foot: Secondary | ICD-10-CM

## 2023-05-10 DIAGNOSIS — E1142 Type 2 diabetes mellitus with diabetic polyneuropathy: Secondary | ICD-10-CM

## 2023-05-10 DIAGNOSIS — M2010 Hallux valgus (acquired), unspecified foot: Secondary | ICD-10-CM

## 2023-05-10 DIAGNOSIS — M2042 Other hammer toe(s) (acquired), left foot: Secondary | ICD-10-CM

## 2023-05-10 NOTE — Progress Notes (Signed)
Patient presents today to pick up diabetic shoes and insoles.  Patient was dispensed 1 pair of diabetic shoes and 3 pairs of foam casted diabetic insoles. Fit was satisfactory. Instructions for break-in and wear was reviewed and a copy was given to the patient.   Re-appointment for regularly scheduled diabetic foot care visits or if they should experience any trouble with the shoes or insoles.  Fernando Taylor Cped, CFo, CFm I emailed ST as shoes were suppose to be black patient was not concerned but told him I will see if they are avail

## 2023-06-08 ENCOUNTER — Ambulatory Visit (INDEPENDENT_AMBULATORY_CARE_PROVIDER_SITE_OTHER): Payer: Medicare Other | Admitting: Podiatry

## 2023-06-08 ENCOUNTER — Encounter: Payer: Self-pay | Admitting: Podiatry

## 2023-06-08 DIAGNOSIS — E1142 Type 2 diabetes mellitus with diabetic polyneuropathy: Secondary | ICD-10-CM | POA: Diagnosis not present

## 2023-06-08 DIAGNOSIS — B351 Tinea unguium: Secondary | ICD-10-CM

## 2023-06-08 DIAGNOSIS — M79674 Pain in right toe(s): Secondary | ICD-10-CM

## 2023-06-08 DIAGNOSIS — M79675 Pain in left toe(s): Secondary | ICD-10-CM | POA: Diagnosis not present

## 2023-06-08 NOTE — Progress Notes (Signed)
This patient returns to my office for at risk foot care.  This patient requires this care by a professional since this patient will be at risk due to having diabetes neuropathy.  This patient is unable to cut nails himself since the patient cannot reach his nails.These nails are painful walking and wearing shoes.  This patient presents for at risk foot care today. Patient has received his diabetic shoes.  General Appearance  Alert, conversant and in no acute stress.  Vascular  Dorsalis pedis and posterior tibial  pulses are weakly  palpable  bilaterally.  Capillary return is within normal limits  bilaterally. Temperature is within normal limits  bilaterally.  Neurologic  Senn-Weinstein monofilament wire test absent bilaterally. Muscle power diminished bilaterally.  Nails Thick disfigured discolored nails with subungual debris  Hallux nails  B/L. No evidence of bacterial infection or drainage bilaterally.  Orthopedic  No limitations of motion  feet .  No crepitus or effusions noted.  No bony pathology or digital deformities noted.  Mild  HAV  B/L. Hammer toes  B/L.  Skin  normotropic skin with no porokeratosis noted bilaterally.  No signs of infections or ulcers noted.     Onychomycosis  Pain in right toes  Pain in left toes  Consent was obtained for treatment procedures.   Mechanical debridement of nails 1-5  bilaterally performed with a nail nipper.  Filed with dremel without incident.  Patient is asking for diabetic shoes in 2024.   Return office visit   3   months                  Told patient to return for periodic foot care and evaluation due to potential at risk complications.   Zaidyn Claire DPM  

## 2023-08-09 ENCOUNTER — Telehealth: Payer: Self-pay

## 2023-08-09 NOTE — Telephone Encounter (Signed)
Patient is aware and remembers that the shoe he wants is dis-continued and they only have limited sizing avail.  He will look at [employer] website to see what else he may want and let us know

## 2023-08-14 ENCOUNTER — Telehealth: Payer: Self-pay

## 2023-08-14 NOTE — Telephone Encounter (Signed)
 Called pt to let him know I got his voicemail, he called to tell me he picked out shoes on [employer], and they are the [employer]ances [id number]

## 2023-08-21 ENCOUNTER — Encounter: Payer: Self-pay | Admitting: Cardiovascular Disease

## 2023-08-21 NOTE — Progress Notes (Unsigned)
 Cardiology Office Note:    Date:  08/22/2023   ID:  Fernando Taylor, DOB Jan 28, 1941, MRN [id number]  PCP:  Darrow Bussing, MD  Cardiologist:  Kristeen Miss, MD  Electrophysiologist:  None   Referring MD: Darrow Bussing, MD   Chief Complaint  Patient presents with   Atrial Fibrillation       Atrial fib   History of Present Illness:    Fernando Taylor is a 83 y.o. male with a hx of Atrial fib . - s/p ablation  Has hx of HTN, HLD , DM  With peripheral neuropathy,  Has developed an essential tremor .   Has rare episodes of indigestion  Had a stress test in late 2019 and then he moved to [facility] ( from [place])  Has lived in  years ago .  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   Was told to get a nuclear stress  Started back exercising - 1/2 hour of stationary bike No CP,  Also walks around the yard and house .   Recent labs from his primary medical doctor reveals a hemoglobin A1c of 7.1.  Total cholesterol is 174.  The HDL is 63.  The triglyceride level is 234.  The LDL is 73.  TSH is 2.61.  Glucose is 128.  Creatinine is 0.84.  Sodium is 141 potassium is 5.2 liver enzymes are normal.  CBC is normal.  PSA is less than 0.01.   August 17, 2021: Fernando Taylor is seen today for follow-up of his atrial fibrillation. Had screening ABI - non diagnostic  Had duplex   Has occasional episodes of CP ,  lasts for several seconds  Does not occur when he is doing his stationary bike work up  Occurs out of the blue, not with eating    Feb. 23, 2024 Fernando Taylor is seen today for follow up of his atrial fib  Hx of Afib ablation  Has maintained NSR .  Uses his Lourena Simmonds mobile regularly  Is on ASA 81 mg a day  Rides stationary bike 6-7 miles a day  Walks , develops heavy legs with walking  No cramps   Had lower extremity ABI Non compressible / Non diagnostic but likely due to diffuse PAD   Feb. 25, 2025 Fernando Taylor is seen for follow up of his atrial fib, HTN  Had Afib ablation in 2017 at [facility]  He sees Dr. Gery Pray for  peripheral arterial disease. Rides his stationary bike , rode 8 miles recently  Had persistent tachycardia following his bike ride , Was wearing a heart monitor ( from his research study)   Echocardiogram from March, 2024 reveals a dynamic LVOT obstruction.  He also has mild mitral regurgitation, mild aortic regurgitation Aorta was minimally dilated at 40 mm. Will have him see Dr. Izora Ribas in 1 year      Past Medical History:  Diagnosis Date   Allergies    Diabetes mellitus (HCC)    History of prostate cancer 2010   NO RADIATION   History of tremor    NOT HEN RESTING, MOSTLY INTENTION TREMOR AND AT A CERTAIN ANGLE, ABLE TO WRITE. HAS NOT HAD MORE EVALUATION, WAS BEING OBSERVED.   HLD (hyperlipidemia)    Hx of atrial fibrillation without current medication 2014   S/P ABLATION IN [place],    Hypertension    Paroxysmal atrial fibrillation (HCC)    Thrombocytopenia (HCC)    Tremor     Past Surgical History:  Procedure Laterality Date   ATRIAL FIBRILLATION ABLATION  2014  S/P   COLONOSCOPY     DUE NOW, APRIL 2020   KIDNEY STONE SURGERY     PROSTATECTOMY  2010    Current Medications: Current Meds  Medication Sig   Ascorbic Acid (VITAMIN C WITH ROSE HIPS) 500 MG tablet Take 500 mg by mouth daily. 10 mg rose hip   aspirin EC 81 MG tablet Take 81 mg by mouth daily. Takes 2 tablets at night   Calcium Carbonate-Vit D-Min (CALCIUM 1200) 1200-1000 MG-UNIT CHEW    Cholecalciferol (VITAMIN D3) 125 MCG (5000 UT) CAPS Take by mouth.   glimepiride (AMARYL) 4 MG tablet Take 4 mg by mouth daily with breakfast. Twice daily   JARDIANCE 25 MG TABS tablet Take 25 mg by mouth daily.   Krill Oil 500 MG CAPS Take by mouth.   loratadine (CLARITIN) 10 MG tablet 1 tablet   lovastatin (MEVACOR) 10 MG tablet Take 10 mg by mouth at bedtime.   Magnesium 250 MG TABS Take by mouth daily.   metFORMIN (GLUCOPHAGE) 1000 MG tablet Take 1,000 mg by mouth 2 (two) times daily with a meal.   METOPROLOL  TARTRATE PO Take 25 mg by mouth 2 (two) times daily.   Multiple Vitamin (MULTIVITAMIN) tablet Take 1 tablet by mouth daily.   ONETOUCH ULTRA test strip daily. use as directed   ramipril (ALTACE) 2.5 MG capsule Take 2.5 mg by mouth daily.   repaglinide (PRANDIN) 1 MG tablet Take 1 mg by mouth 2 (two) times daily. 1 tablet am,2 tablets  pm     Allergies:   Patient has no known allergies.   Social History   Socioeconomic History   Marital status: Married    Spouse name: Not on file   Number of children: 2   Years of education: Not on file   Highest education level: Not on file  Occupational History   Not on file  Tobacco Use   Smoking status: Never   Smokeless tobacco: Never  Vaping Use   Vaping status: Never Used  Substance and Sexual Activity   Alcohol use: Never   Drug use: Never   Sexual activity: Not on file  Other Topics Concern   Not on file  Social History Narrative   Lives with wife   Social Drivers of Health   Financial Resource Strain: Not on file  Food Insecurity: Not on file  Transportation Needs: Not on file  Physical Activity: Not on file  Stress: Not on file  Social Connections: Not on file     Family History: The patient's family history includes CAD in his brother and father; Cerebrovascular Accident in his mother; Diabetes in his mother; Pulmonary fibrosis in his sister; Stroke in his brother.  ROS:   Please see the history of present illness.     All other systems reviewed and are negative.  EKGs/Labs/Other Studies Reviewed:    The following studies were reviewed today:   EKG:     EKG Interpretation Date/Time:  Tuesday August 22 2023 10:33:19 EST Ventricular Rate:  61 PR Interval:  232 QRS Duration:  78 QT Interval:  390 QTC Calculation: 392 R Axis:   19  Text Interpretation: Sinus rhythm with 1st degree A-V block No previous ECGs available Confirmed by Kristeen Miss ([id number]) on 08/22/2023 10:48:12 AM      Recent Labs: No results  found for requested labs within last 365 days.  Recent Lipid Panel No results found for: "CHOL", "TRIG", "HDL", "CHOLHDL", "VLDL", "LDLCALC", "LDLDIRECT"  Physical Exam:  Physical Exam: Blood pressure 116/74, pulse 61, height 5' 10.5" (1.791 m), weight 197 lb 12.8 oz (89.7 kg), SpO2 96%.       GEN:  Well nourished, well developed in no acute distress HEENT: Normal NECK: No JVD; No carotid bruits LYMPHATICS: No lymphadenopathy CARDIAC: RRR , very soft systolic murmur RESPIRATORY:  Clear to auscultation without rales, wheezing or rhonchi  ABDOMEN: Soft, non-tender, non-distended MUSCULOSKELETAL:  No edema; No deformity  SKIN: Warm and dry NEUROLOGIC:  Alert and oriented x 3    ASSESSMENT:    1. Peripheral arterial disease (HCC)   2. Nonspecific abnormal electrocardiogram (ECG) (EKG)   3. Mixed hyperlipidemia   4. Dynamic left ventricular outflow obstruction     PLAN:       History of atrial fibrillation-status post RF ablation.    Has maintained NSR .    2.  Mild carotid artery disease:      3.  Hyperlipidemia:   managed by his primary md   4.  Hypertension:   BP is well controlled.   5.  ? Old Inf MI :  ECG is unchanged but does show inf Q waves.   He does not recall every having any symptoms  Will get an echo to assess LV function    Medication Adjustments/Labs and Tests Ordered: Current medicines are reviewed at length with the patient today.  Concerns regarding medicines are outlined above.  Orders Placed This Encounter  Procedures   EKG 12-Lead   No orders of the defined types were placed in this encounter.   Patient Instructions  Follow-Up: At [facility], you and your health needs are our priority.  As part of our continuing mission to provide you with exceptional heart care, we have created designated Provider Care Teams.  These Care Teams include your primary Cardiologist (physician) and Advanced Practice Providers (APPs -   Physician Assistants and Nurse Practitioners) who all work together to provide you with the care you need, when you need it.  Your next appointment:   1 year(s)  Provider:   Riley Lam, MD   1st Floor: - Lobby - Registration  - Pharmacy  - Lab - Cafe  2nd Floor: - PV Lab - Diagnostic Testing (echo, CT, nuclear med)  3rd Floor: - Vacant  4th Floor: - TCTS (cardiothoracic surgery) - AFib Clinic - Structural Heart Clinic - Vascular Surgery  - Vascular Ultrasound  5th Floor: - HeartCare Cardiology (general and EP) - Clinical Pharmacy for coumadin, hypertension, lipid, weight-loss medications, and med management appointments    Valet parking services will be available as well.     Signed, Kristeen Miss, MD  08/22/2023 5:12 PM    [facility]

## 2023-08-22 ENCOUNTER — Encounter: Payer: Self-pay | Admitting: Cardiovascular Disease

## 2023-08-22 ENCOUNTER — Ambulatory Visit: Payer: Medicare Other | Attending: Cardiovascular Disease | Admitting: Cardiovascular Disease

## 2023-08-22 VITALS — BP 116/74 | HR 61 | Ht 70.5 in | Wt 197.8 lb

## 2023-08-22 DIAGNOSIS — R9431 Abnormal electrocardiogram [ECG] [EKG]: Secondary | ICD-10-CM | POA: Diagnosis present

## 2023-08-22 DIAGNOSIS — E782 Mixed hyperlipidemia: Secondary | ICD-10-CM | POA: Diagnosis present

## 2023-08-22 DIAGNOSIS — I739 Peripheral vascular disease, unspecified: Secondary | ICD-10-CM | POA: Diagnosis present

## 2023-08-22 DIAGNOSIS — I5189 Other ill-defined heart diseases: Secondary | ICD-10-CM | POA: Diagnosis present

## 2023-08-22 NOTE — Patient Instructions (Signed)
 Follow-Up: At [facility], you and your health needs are our priority.  As part of our continuing mission to provide you with exceptional heart care, we have created designated Provider Care Teams.  These Care Teams include your primary Cardiologist (physician) and Advanced Practice Providers (APPs -  Physician Assistants and Nurse Practitioners) who all work together to provide you with the care you need, when you need it.  Your next appointment:   1 year(s)  Provider:   Riley Lam, MD   1st Floor: - Lobby - Registration  - Pharmacy  - Lab - Cafe  2nd Floor: - PV Lab - Diagnostic Testing (echo, CT, nuclear med)  3rd Floor: - Vacant  4th Floor: - TCTS (cardiothoracic surgery) - AFib Clinic - Structural Heart Clinic - Vascular Surgery  - Vascular Ultrasound  5th Floor: - HeartCare Cardiology (general and EP) - Clinical Pharmacy for coumadin, hypertension, lipid, weight-loss medications, and med management appointments    Valet parking services will be available as well.

## 2023-09-07 ENCOUNTER — Ambulatory Visit: Payer: Medicare Other | Admitting: Podiatry

## 2024-03-29 ENCOUNTER — Telehealth: Payer: Self-pay

## 2024-03-29 ENCOUNTER — Telehealth (HOSPITAL_BASED_OUTPATIENT_CLINIC_OR_DEPARTMENT_OTHER): Payer: Self-pay | Admitting: *Deleted

## 2024-03-29 NOTE — Telephone Encounter (Signed)
 Pt has been scheduled tele preop appt 04/03/24. Med rec and consent are done.      Patient Consent for Virtual Visit        Gilmore List has provided verbal consent on 03/29/2024 for a virtual visit (video or telephone).   CONSENT FOR VIRTUAL VISIT FOR:  Fernando Taylor  By participating in this virtual visit I agree to the following:  I hereby voluntarily request, consent and authorize [facility] and its employed or contracted physicians, physician assistants, nurse practitioners or other licensed health care professionals (the Practitioner), to provide me with telemedicine health care services (the "Services) as deemed necessary by the treating Practitioner. I acknowledge and consent to receive the Services by the Practitioner via telemedicine. I understand that the telemedicine visit will involve communicating with the Practitioner through live audiovisual communication technology and the disclosure of certain medical information by electronic transmission. I acknowledge that I have been given the opportunity to request an in-person assessment or other available alternative prior to the telemedicine visit and am voluntarily participating in the telemedicine visit.  I understand that I have the right to withhold or withdraw my consent to the use of telemedicine in the course of my care at any time, without affecting my right to future care or treatment, and that the Practitioner or I may terminate the telemedicine visit at any time. I understand that I have the right to inspect all information obtained and/or recorded in the course of the telemedicine visit and may receive copies of available information for a reasonable fee.  I understand that some of the potential risks of receiving the Services via telemedicine include:  Delay or interruption in medical evaluation due to technological equipment failure or disruption; Information transmitted may not be sufficient (e.g. poor  resolution of images) to allow for appropriate medical decision making by the Practitioner; and/or  In rare instances, security protocols could fail, causing a breach of personal health information.  Furthermore, I acknowledge that it is my responsibility to provide information about my medical history, conditions and care that is complete and accurate to the best of my ability. I acknowledge that Practitioner's advice, recommendations, and/or decision may be based on factors not within their control, such as incomplete or inaccurate data provided by me or distortions of diagnostic images or specimens that may result from electronic transmissions. I understand that the practice of medicine is not an exact science and that Practitioner makes no warranties or guarantees regarding treatment outcomes. I acknowledge that a copy of this consent can be made available to me via my patient portal [facility] MyChart), or I can request a printed copy by calling the office of [facility].    I understand that my insurance will be billed for this visit.   I have read or had this consent read to me. I understand the contents of this consent, which adequately explains the benefits and risks of the Services being provided via telemedicine.  I have been provided ample opportunity to ask questions regarding this consent and the Services and have had my questions answered to my satisfaction. I give my informed consent for the services to be provided through the use of telemedicine in my medical care

## 2024-03-29 NOTE — Telephone Encounter (Signed)
   Name: Fernando Taylor  DOB: Oct 25, 1940  MRN: [id number]  Primary Cardiologist: Stanly DELENA Leavens, MD   Preoperative team, please contact this patient and set up a phone call appointment ASAP for surgery scheduled on 04/16/2024 for further preoperative risk assessment. Please obtain consent and complete medication review. Thank you for your help.  I confirm that guidance regarding antiplatelet and oral anticoagulation therapy has been completed and, if necessary, noted below.  Per office protocol, if patient is without any new symptoms or concerns at the time of their virtual visit, he may hold ASA for 7 days prior to procedure. Please resume ASA as soon as possible postprocedure, at the discretion of the surgeon.    I also confirmed the patient resides in the state of [place] . As per [facility] Medical Board telemedicine laws, the patient must reside in the state in which the provider is licensed.   [name], NP 03/29/2024, 1:08 PM [facility]

## 2024-03-29 NOTE — Telephone Encounter (Signed)
   Pre-operative Risk Assessment    Patient Name: Fernando Taylor  DOB: 1940/07/31 MRN: [id number]   Date of last office visit: 08/22/23 ALEENE PASSE, MD Date of next office visit: NONE   Request for Surgical Clearance    Procedure:  RIGHT KNEE ARTHROPLASTY  Date of Surgery:  Clearance 04/16/24                                Surgeon:  DR DEMPSEY MOAN Surgeon's Group or Practice Name:  [facility] Phone number:  [phone] Fax number:  [phone]   Type of Clearance Requested:   - Medical  - Pharmacy:  Hold Aspirin     Type of Anesthesia:  CHOICE   Additional requests/questions:    SignedLucie DELENA Ku   03/29/2024, 12:21 PM

## 2024-03-29 NOTE — Telephone Encounter (Signed)
 Pt has been scheduled tele preop appt 04/03/24. Med rec and consent are done.

## 2024-04-04 ENCOUNTER — Ambulatory Visit: Attending: Cardiovascular Disease

## 2024-04-04 DIAGNOSIS — Z01810 Encounter for preprocedural cardiovascular examination: Secondary | ICD-10-CM

## 2024-04-04 NOTE — Progress Notes (Signed)
 Virtual Visit via Telephone Note   Because of Fernando Taylor co-morbid illnesses, he is at least at moderate risk for complications without adequate follow up.  This format is felt to be most appropriate for this patient at this time.  Due to technical limitations with video connection (technology), today's appointment will be conducted as an audio only telehealth visit, and Fernando Taylor verbally agreed to proceed in this manner.   All issues noted in this document were discussed and addressed.  No physical exam could be performed with this format.  Evaluation Performed:  Preoperative cardiovascular risk assessment _____________   Date:  04/04/2024   Patient ID:  Fernando Taylor, DOB October 01, 1940, MRN [id number] Patient Location:  Home Provider location:   Office  Primary Care Provider:  Regino Slater, MD Primary Cardiologist:  Stanly DELENA Leavens, MD  Chief Complaint / Patient Profile   83 y.o. y/o male with a h/o peripheral arterial disease, hyperlipidemia who is pending RIGHT KNEE ARTHROPLAST and presents today for telephonic preoperative cardiovascular risk assessment.  History of Present Illness    Fernando Taylor is a 83 y.o. male who presents via audio/video conferencing for a telehealth visit today.  Pt was last seen in cardiology clinic on 08/22/2023 by Dr. Alveta.  At that time Fernando Taylor was doing well .  The patient is now pending procedure as outlined above. Since his last visit, he remains stable from a cardiac standpoint  Today he denies chest pain, shortness of breath, lower extremity edema, fatigue, palpitations, melena, hematuria, hemoptysis, diaphoresis, weakness, presyncope, syncope, orthopnea, and PND.   Past Medical History    Past Medical History:  Diagnosis Date   Allergies    Diabetes mellitus (HCC)    History of prostate cancer 2010   NO RADIATION   History of tremor    NOT HEN RESTING, MOSTLY INTENTION TREMOR AND AT A CERTAIN ANGLE, ABLE TO  WRITE. HAS NOT HAD MORE EVALUATION, WAS BEING OBSERVED.   HLD (hyperlipidemia)    Hx of atrial fibrillation without current medication 2014   S/P ABLATION IN [place],    Hypertension    Paroxysmal atrial fibrillation (HCC)    Thrombocytopenia    Tremor    Past Surgical History:  Procedure Laterality Date   ATRIAL FIBRILLATION ABLATION  2014   S/P   COLONOSCOPY     DUE NOW, APRIL 2020   KIDNEY STONE SURGERY     PROSTATECTOMY  2010    Allergies  No Known Allergies  Home Medications    Prior to Admission medications   Medication Sig Start Date End Date Taking? Authorizing Provider  Ascorbic Acid (VITAMIN C WITH ROSE HIPS) 500 MG tablet Take 500 mg by mouth daily. 10 mg rose hip Patient taking differently: Take 500 mg by mouth daily. 10 mg rose hip  PT STATED 03/29/24 JUST VITAMIN C; NO ROSE HIPS    [provider]  aspirin EC 81 MG tablet Take 81 mg by mouth daily. Takes 2 tablets at night    [provider]  Calcium Carbonate-Vit D-Min (CALCIUM 1200) 1200-1000 MG-UNIT CHEW  01/03/22   [provider]  Cholecalciferol (VITAMIN D3) 125 MCG (5000 UT) CAPS Take by mouth.    [provider]  Coenzyme Q10 (CO Q-10 PO) Take by mouth.    [provider]  glimepiride (AMARYL) 4 MG tablet Take 4 mg by mouth daily with breakfast. Twice daily    [provider]  JARDIANCE 25 MG TABS tablet Take 25  mg by mouth daily. 07/25/22   [provider]  Anselm Frizzle 500 MG CAPS Take by mouth.    [provider]  loratadine (CLARITIN) 10 MG tablet 1 tablet    [provider]  lovastatin (MEVACOR) 10 MG tablet Take 10 mg by mouth at bedtime.    [provider]  Magnesium 250 MG TABS Take by mouth daily.    [provider]  metFORMIN (GLUCOPHAGE) 1000 MG tablet Take 1,000 mg by mouth 2 (two) times daily with a meal. 07/24/20   [provider]  METOPROLOL TARTRATE PO Take 25 mg by mouth 2 (two) times daily.     [provider]  Multiple Vitamin (MULTIVITAMIN) tablet Take 1 tablet by mouth daily.    [provider]  [facility] ULTRA test strip daily. use as directed 10/08/19   [provider]  ramipril (ALTACE) 2.5 MG capsule Take 2.5 mg by mouth daily.    [provider]  repaglinide (PRANDIN) 1 MG tablet Take 1 mg by mouth 2 (two) times daily. 1 tablet am,2 tablets  pm    [provider]    Physical Exam    Vital Signs:  Fernando Taylor does not have vital signs available for review today.  Given telephonic nature of communication, physical exam is limited. AAOx3. NAD. Normal affect.  Speech and respirations are unlabored.  Accessory Clinical Findings    None  Assessment & Plan    1.  Preoperative Cardiovascular Risk Assessment: RIGHT KNEE ARTHROPLASTY   Date of Surgery:  Clearance 04/16/24                                  Surgeon:  DR DEMPSEY MOAN Surgeon's Group or Practice Name:  [facility] Phone number:  [phone] Fax number:  [phone]      Primary Cardiologist: Stanly DELENA Leavens, MD  Chart reviewed as part of pre-operative protocol coverage. Given past medical history and time since last visit, based on ACC/AHA guidelines, Fernando Taylor would be at acceptable risk for the planned procedure without further cardiovascular testing.   His RCRI is a very low risk, 0.4% risk of major cardiac event.  He is able to complete greater than 4 METS of physical activity.  Patient was advised that if he develops new symptoms prior to surgery to contact our office to arrange a follow-up appointment.  He verbalized understanding.  Per office protocol, if patient is without any new symptoms or concerns at the time of their virtual visit, he may hold ASA for 7 days prior to procedure. Please resume ASA as soon as possible postprocedure, at the discretion of the surgeon.    I will route this recommendation to the requesting party via  Epic fax function and remove from pre-op pool.       Time:   Today, I have spent 5 minutes with the patient with telehealth technology discussing medical history, symptoms, and management plan.  I spent 10 minutes reviewing patient's past cardiac history and cardiac medications.    Josefa CHRISTELLA Beauvais, NP  04/04/2024, 7:28 AM

## 2024-05-27 ENCOUNTER — Encounter: Payer: Self-pay | Admitting: Rehabilitation

## 2024-05-27 ENCOUNTER — Other Ambulatory Visit: Payer: Self-pay

## 2024-05-27 ENCOUNTER — Ambulatory Visit: Attending: Orthopedic Surgery | Admitting: Rehabilitation

## 2024-05-27 DIAGNOSIS — R252 Cramp and spasm: Secondary | ICD-10-CM | POA: Diagnosis present

## 2024-05-27 DIAGNOSIS — M25561 Pain in right knee: Secondary | ICD-10-CM | POA: Diagnosis present

## 2024-05-27 DIAGNOSIS — R2689 Other abnormalities of gait and mobility: Secondary | ICD-10-CM | POA: Insufficient documentation

## 2024-05-27 DIAGNOSIS — R293 Abnormal posture: Secondary | ICD-10-CM | POA: Diagnosis present

## 2024-05-27 DIAGNOSIS — M25661 Stiffness of right knee, not elsewhere classified: Secondary | ICD-10-CM | POA: Insufficient documentation

## 2024-05-27 DIAGNOSIS — R262 Difficulty in walking, not elsewhere classified: Secondary | ICD-10-CM | POA: Diagnosis present

## 2024-05-27 DIAGNOSIS — M6281 Muscle weakness (generalized): Secondary | ICD-10-CM | POA: Diagnosis present

## 2024-05-27 NOTE — Therapy (Signed)
 OUTPATIENT PHYSICAL THERAPY LOWER EXTREMITY EVALUATION   Patient Name: Fernando Taylor MRN: [id number] DOB:06-25-1941, 83 y.o., male Today's Date: 05/27/2024  END OF SESSION:  PT End of Session - 05/27/24 1104     Visit Number 1    Number of Visits 13    Date for Recertification  07/08/24    PT Start Time 0955    PT Stop Time 1045    PT Time Calculation (min) 50 min    Activity Tolerance Patient tolerated treatment well    Behavior During Therapy WFL for tasks assessed/performed          Past Medical History:  Diagnosis Date   Allergies    Diabetes mellitus (HCC)    History of prostate cancer 2010   NO RADIATION   History of tremor    NOT HEN RESTING, MOSTLY INTENTION TREMOR AND AT A CERTAIN ANGLE, ABLE TO WRITE. HAS NOT HAD MORE EVALUATION, WAS BEING OBSERVED.   HLD (hyperlipidemia)    Hx of atrial fibrillation without current medication 2014   S/P ABLATION IN [place],    Hypertension    Paroxysmal atrial fibrillation (HCC)    Thrombocytopenia    Tremor    Past Surgical History:  Procedure Laterality Date   ATRIAL FIBRILLATION ABLATION  2014   S/P   COLONOSCOPY     DUE NOW, APRIL 2020   KIDNEY STONE SURGERY     PROSTATECTOMY  2010   Patient Active Problem List   Diagnosis Date Noted   Peripheral arterial disease 09/09/2022   Persistent atrial fibrillation (HCC) 07/25/2019   Carotid artery disease 07/25/2019   Hyperlipidemia 07/25/2019   Pain due to onychomycosis of toenails of both feet 07/19/2019   Diabetic neuropathy (HCC) 07/19/2019    PCP: Regino Slater, MD  REFERRING PROVIDER: Melodi Lerner, MD  REFERRING DIAG:  Z51.89 (ICD-10-CM) - Encounter for other specified aftercare    THERAPY DIAG:  Acute pain of right knee  Stiffness of right knee, not elsewhere classified  Other abnormalities of gait and mobility  Rationale for Evaluation and Treatment: Rehabilitation  ONSET DATE: 04/16/24  SUBJECTIVE:   SUBJECTIVE STATEMENT: Patient underwent  surgery on 04/16/24. Patient repots he initially injured his knee after falling into his his attic when he missed a step. He had arthroscopic surgery in the Lt knee 10 years ago and he recovered well from that. Patient reports he is still unable to squat and feels like his knee is still swollen. He uses ice 2-3 times a day. Patient has a stationary bike and he was doing 6 miles a day but has not been able to do this since surgery; he is currently only able to do 3 miles. Patient states that his diabetic neuropathy is causing his balance to be worse.   PERTINENT HISTORY: Underwent arthroscopy with meniscal debridement following complex medial meniscal tear on Rt knee. Diabetic neuropathy PAIN:  Are you having pain? Yes: NPRS scale: 4/10 Pain location: Inner portion of Rt knee Pain description: Dull Aggravating factors: Squatting Relieving factors: Ice helps numb it, Advil PRN  PRECAUTIONS: None  RED FLAGS: None   WEIGHT BEARING RESTRICTIONS: No  FALLS:  Has patient fallen in last 6 months? Yes. Number of falls Tripped going into his attic which caused the original injury  LIVING ENVIRONMENT: Lives with: lives with their spouse Lives in: House/apartment Stairs: Yes but does not use them  OCCUPATION: Retired  PLOF: Independent  PATIENT GOALS: Wants to be able to walk normally & get back  to biking  NEXT MD VISIT: 07/05/23  OBJECTIVE:  Note: Objective measures were completed at Evaluation unless otherwise noted.  PATIENT SURVEYS:  LEFS  Extreme difficulty/unable (0), Quite a bit of difficulty (1), Moderate difficulty (2), Little difficulty (3), No difficulty (4) Survey date:  05/27/24  Any of your usual work, housework or school activities 4  2. Usual hobbies, recreational or sporting activities 4  3. Getting into/out of the bath 4  4. Walking between rooms 4  5. Putting on socks/shoes 3  6. Squatting  1  7. Lifting an object, like a bag of groceries from the floor 3  8.  Performing light activities around your home 4  9. Performing heavy activities around your home 3  10. Getting into/out of a car 2  11. Walking 2 blocks 3  12. Walking 1 mile 3  13. Going up/down 10 stairs (1 flight) 3  14. Standing for 1 hour 3  15.  sitting for 1 hour 4  16. Running on even ground 1  17. Running on uneven ground 1  18. Making sharp turns while running fast 1  19. Hopping  2  20. Rolling over in bed 3  Score total:  56/80     COGNITION: Overall cognitive status: Within functional limits for tasks assessed    EDEMA: Increased edema on the Rt knee  POSTURE: rounded shoulders and forward head  PALPATION: TTP at Rt mid and distal quadriceps and medial aspect of Rt knee  LOWER EXTREMITY MMT:  MMT Right eval Left eval  Hip flexion 4 pain in hip 5  Hip extension    Hip abduction    Hip adduction    Hip internal rotation    Hip external rotation    Knee flexion 4+ 4+  Knee extension 4+ 5  Ankle dorsiflexion 5 5  Ankle plantarflexion 5 5  Ankle inversion    Ankle eversion     (Blank rows = not tested)  LOWER EXTREMITY ROM:  ROM Right eval Left eval  Hip flexion    Hip extension    Hip abduction    Hip adduction    Hip internal rotation    Hip external rotation    Knee flexion 121 135  Knee extension 10 6  Ankle dorsiflexion    Ankle plantarflexion    Ankle inversion    Ankle eversion     (Blank rows = not tested)  FUNCTIONAL TESTS:  5 times sit to stand: 13.24 sec - increased Rt knee pain SLS: unable to perform bil without UE support  Tandem balance: Rt forward 6 sec; Lt forward 2 sec  GAIT: Distance walked: 90 feet Assistive device utilized: None Level of assistance: Complete Independence Comments: Decreased stance time on Rt leg with compensation by leaning to the Lt  TREATMENT DATE:  05/27/24  Eval, POC,  HEP  PATIENT EDUCATION:  Education details: POC, HEP Person educated: Patient Education method: Chief Technology Officer Education comprehension: verbalized understanding  HOME EXERCISE PROGRAM: Access Code: [email or web address] URL: [email or web address] Date: 05/27/2024 Prepared by: Delon Pack  Exercises - Standing Heel Raise with Support  - 1 x daily - 7 x weekly - 2 sets - 10 reps - Supine Quad Set  - 1 x daily - 7 x weekly - 2 sets - 10 reps - Seated Long Arc Quad  - 1 x daily - 7 x weekly - 2 sets - 10 reps - Standing March with Unilateral Counter Support  - 1 x daily - 7 x weekly - 2 sets - 10 reps - Supine Heel Slide  - 1 x daily - 7 x weekly - 2 sets - 10 reps - 2-3 hold  ASSESSMENT:  CLINICAL IMPRESSION: Patient is an 83 y.o. male who was seen today for physical therapy evaluation and treatment for decreased knee strength/ROM secondary to arthroscopic debridement on 03/28/24. Pertinent PMH includes diabetic neuropathy which negatively impacts his balance. Patient reports he is currently unable to squat and ride his stationary bike 6 miles per day since surgery. Patient has increased edema observed in the right knee and reports tightness due to the edema with knee flexion AROM. Patient's incision sites are well healed with no increased redness or warmth noted. All red flags are ruled out. Patient has functional weakness and ROM deficits where he is unable to squat down and has difficulty getting into/out of his car. The patient has impaired static balance, he is unable to perform SLS bilaterally and can only hold a tandem stance for 2 seconds with the left leg forward and 6 seconds with the right leg forward. He has decreased stance time on the Rt leg with a lateral lean to the left side observed during gait. Patient would benefit from continued skilled physical therapy to address the following: impaired balance, decreased functional mobility/strength, edema, and pain.    OBJECTIVE IMPAIRMENTS: Abnormal gait, decreased activity tolerance, decreased balance, decreased mobility, difficulty walking, decreased ROM, decreased strength, increased edema, and pain.   ACTIVITY LIMITATIONS: carrying, lifting, bending, sitting, squatting, and stairs  PARTICIPATION LIMITATIONS: cleaning, laundry, community activity, and yard work  PERSONAL FACTORS: Age and 1 comorbidity: Diabetic neuropathy are also affecting patient's functional outcome.   REHAB POTENTIAL: Excellent  CLINICAL DECISION MAKING: Stable/uncomplicated  EVALUATION COMPLEXITY: Low   GOALS: Goals reviewed with patient? Yes  SHORT TERM GOALS: Target date: 06/10/24 Patient will be compliant with HEP Baseline: Goal status: INITIAL  2.  Patient will report no pain when riding his stationary bike daily Baseline:  Goal status: INITIAL  LONG TERM GOALS: Target date: 07/08/24  Patient will be able to bike 6 miles on his stationary bike pain free with the seat at it's regular height  Baseline:  Goal status: INITIAL  2.  Patient will improve bilateral SLS to at least 5 seconds without UE support  Baseline:  Goal status: INITIAL  3.  Patient will improve his LEFS score by at least 9 points to demonstrate functional improvement  Baseline: 56/80 Goal status: INITIAL  4.  Patient will improve right knee flexion AROM to be at least 135 to be equal bilaterally  Baseline: 121 Goal status: INITIAL  PLAN:  PT FREQUENCY: 2x/week  PT DURATION: 6 weeks  PLANNED INTERVENTIONS: 97164- PT Re-evaluation, 97110-Therapeutic exercises, 97530- Therapeutic activity, 97112- Neuromuscular re-education, 97535- Self Care, [id number]-  Manual therapy, [id number]- Gait training, [phone]- Electrical stimulation (unattended), [phone]- Electrical stimulation (manual), 79439 (1-2 muscles), 20561 (3+ muscles)- Dry Needling, Patient/Family education, Balance training, Taping, Manual lymph drainage, Scar mobilization, and  Cryotherapy  PLAN FOR NEXT SESSION: Dynamic balance exercises, mini squats, quad stretch, functional LE strengthening   Randall Pack, SPT  05/27/2024, 12:31 PM   I agree with the following treatment note after reviewing documentation. This session was performed under the supervision of a licensed clinician.  Saddie Raw, PT 05/27/24, 12:32 PM

## 2024-05-31 ENCOUNTER — Ambulatory Visit: Admitting: Physical Therapy

## 2024-06-02 NOTE — Therapy (Signed)
 OUTPATIENT PHYSICAL THERAPY LOWER EXTREMITY TREATMENT   Patient Name: Fernando Taylor MRN: [id number] DOB:12/14/1940, 83 y.o., male Today's Date: 06/03/2024  END OF SESSION:  PT End of Session - 06/03/24 1018     Visit Number 2    Number of Visits 13    Date for Recertification  07/08/24    PT Start Time 1018    PT Stop Time 1104    PT Time Calculation (min) 46 min    Activity Tolerance Patient tolerated treatment well    Behavior During Therapy WFL for tasks assessed/performed           Past Medical History:  Diagnosis Date   Allergies    Diabetes mellitus (HCC)    History of prostate cancer 2010   NO RADIATION   History of tremor    NOT HEN RESTING, MOSTLY INTENTION TREMOR AND AT A CERTAIN ANGLE, ABLE TO WRITE. HAS NOT HAD MORE EVALUATION, WAS BEING OBSERVED.   HLD (hyperlipidemia)    Hx of atrial fibrillation without current medication 2014   S/P ABLATION IN [place],    Hypertension    Paroxysmal atrial fibrillation (HCC)    Thrombocytopenia    Tremor    Past Surgical History:  Procedure Laterality Date   ATRIAL FIBRILLATION ABLATION  2014   S/P   COLONOSCOPY     DUE NOW, APRIL 2020   KIDNEY STONE SURGERY     PROSTATECTOMY  2010   Patient Active Problem List   Diagnosis Date Noted   Peripheral arterial disease 09/09/2022   Persistent atrial fibrillation (HCC) 07/25/2019   Carotid artery disease 07/25/2019   Hyperlipidemia 07/25/2019   Pain due to onychomycosis of toenails of both feet 07/19/2019   Diabetic neuropathy (HCC) 07/19/2019    PCP: Regino Slater, MD  REFERRING PROVIDER: Melodi Lerner, MD  REFERRING DIAG:  Z51.89 (ICD-10-CM) - Encounter for other specified aftercare    THERAPY DIAG:  Acute pain of right knee  Stiffness of right knee, not elsewhere classified  Other abnormalities of gait and mobility  Rationale for Evaluation and Treatment: Rehabilitation  ONSET DATE: 04/16/24  SUBJECTIVE:   SUBJECTIVE STATEMENT:   My feet are  bothering me today.   EVAL: Patient underwent surgery on 04/16/24. Patient repots he initially injured his knee after falling into his his attic when he missed a step. He had arthroscopic surgery in the Lt knee 10 years ago and he recovered well from that. Patient reports he is still unable to squat and feels like his knee is still swollen. He uses ice 2-3 times a day. Patient has a stationary bike and he was doing 6 miles a day but has not been able to do this since surgery; he is currently only able to do 3 miles. Patient states that his diabetic neuropathy is causing his balance to be worse.   PERTINENT HISTORY: Underwent arthroscopy with meniscal debridement following complex medial meniscal tear on Rt knee. Diabetic neuropathy PAIN:  Are you having pain? Yes: NPRS scale: 2/10 Pain location: Inner portion of Rt knee Pain description: Dull Aggravating factors: Squatting Relieving factors: Ice helps numb it, Advil PRN  PRECAUTIONS: None  RED FLAGS: None   WEIGHT BEARING RESTRICTIONS: No  FALLS:  Has patient fallen in last 6 months? Yes. Number of falls Tripped going into his attic which caused the original injury  LIVING ENVIRONMENT: Lives with: lives with their spouse Lives in: House/apartment Stairs: Yes but does not use them  OCCUPATION: Retired  PLOF: Independent  PATIENT GOALS: Wants to be able to walk normally & get back to biking  NEXT MD VISIT: 07/05/23  OBJECTIVE:  Note: Objective measures were completed at Evaluation unless otherwise noted.  PATIENT SURVEYS:  LEFS  Extreme difficulty/unable (0), Quite a bit of difficulty (1), Moderate difficulty (2), Little difficulty (3), No difficulty (4) Survey date:  05/27/24  Any of your usual work, housework or school activities 4  2. Usual hobbies, recreational or sporting activities 4  3. Getting into/out of the bath 4  4. Walking between rooms 4  5. Putting on socks/shoes 3  6. Squatting  1  7. Lifting an object,  like a bag of groceries from the floor 3  8. Performing light activities around your home 4  9. Performing heavy activities around your home 3  10. Getting into/out of a car 2  11. Walking 2 blocks 3  12. Walking 1 mile 3  13. Going up/down 10 stairs (1 flight) 3  14. Standing for 1 hour 3  15.  sitting for 1 hour 4  16. Running on even ground 1  17. Running on uneven ground 1  18. Making sharp turns while running fast 1  19. Hopping  2  20. Rolling over in bed 3  Score total:  56/80     COGNITION: Overall cognitive status: Within functional limits for tasks assessed    EDEMA: Increased edema on the Rt knee  POSTURE: rounded shoulders and forward head  PALPATION: TTP at Rt mid and distal quadriceps and medial aspect of Rt knee  LOWER EXTREMITY MMT:  MMT Right eval Left eval  Hip flexion 4 pain in hip 5  Hip extension    Hip abduction    Hip adduction    Hip internal rotation    Hip external rotation    Knee flexion 4+ 4+  Knee extension 4+ 5  Ankle dorsiflexion 5 5  Ankle plantarflexion 5 5  Ankle inversion    Ankle eversion     (Blank rows = not tested)  LOWER EXTREMITY ROM:  ROM Right eval Left eval  Hip flexion    Hip extension    Hip abduction    Hip adduction    Hip internal rotation    Hip external rotation    Knee flexion 121 135  Knee extension 10 6  Ankle dorsiflexion    Ankle plantarflexion    Ankle inversion    Ankle eversion     (Blank rows = not tested)  FUNCTIONAL TESTS:  5 times sit to stand: 13.24 sec - increased Rt knee pain SLS: unable to perform bil without UE support  Tandem balance: Rt forward 6 sec; Lt forward 2 sec  GAIT: Distance walked: 90 feet Assistive device utilized: None Level of assistance: Complete Independence Comments: Decreased stance time on Rt leg with compensation by leaning to the Lt  TREATMENT DATE:  06/03/24 Bike L 3 x 4 min PT present to discuss status HS stretch at stairs 2x30 sec Quad stretch at stairs using chair 2x30 sec Standing heel raise 2 x 10 Standing March x 20 B Mini squats x 20 Wall slides x 15 pain free range Supine quad set x 10  Supine heel slide review  Seated LAQ x 10 Seated HS stretch with strap R Semi romberg stance with head turns 1 x 5 B 3 way step outs x 5 B   05/27/24  Eval, POC, HEP  PATIENT EDUCATION:  Education details: POC, HEP Person educated: Patient Education method: Chief Technology Officer Education comprehension: verbalized understanding  HOME EXERCISE PROGRAM: Access Code: [email or web address] URL: [email or web address] Date: 06/03/2024 Prepared by: Mliss  Exercises - Seated Hamstring Stretch with Strap  - 2 x daily - 7 x weekly - 1 sets - 3 reps - 60 seconds hold - Wall Quarter Squat  - 1 x daily - 3 x weekly - 2 sets - 10 reps - Standing Romberg to 1/2 Tandem Stance  - 2 x daily - 7 x weekly - 1 sets - 5 reps - max hold  ASSESSMENT:  CLINICAL IMPRESSION: Patient returns for first f/u visit after eval. He tolerated all exercises well today. Good demonstration of all exercises with minimal cueing required. He is very tight in his HS and quads. He prefers the seated stretch with strap so this issued for HEP. He is challenged with half Romberg stance and head turns, but did well with step outs. Stepping straight ahead is more challenging than diagonals. He denied ice at end of session as he ices at home.    Eval: Patient is an 83 y.o. male who was seen today for physical therapy evaluation and treatment for decreased knee strength/ROM secondary to arthroscopic debridement on 04/16/24. Pertinent PMH includes diabetic neuropathy which negatively impacts his balance. Patient reports he is currently unable to squat and ride his stationary bike 6 miles per day since surgery. Patient has increased edema observed in the  right knee and reports tightness due to the edema with knee flexion AROM. Patient's incision sites are well healed with no increased redness or warmth noted. All red flags are ruled out. Patient has functional weakness and ROM deficits where he is unable to squat down and has difficulty getting into/out of his car. The patient has impaired static balance, he is unable to perform SLS bilaterally and can only hold a tandem stance for 2 seconds with the left leg forward and 6 seconds with the right leg forward. He has decreased stance time on the Rt leg with a lateral lean to the left side observed during gait. Patient would benefit from continued skilled physical therapy to address the following: impaired balance, decreased functional mobility/strength, edema, and pain.   OBJECTIVE IMPAIRMENTS: Abnormal gait, decreased activity tolerance, decreased balance, decreased mobility, difficulty walking, decreased ROM, decreased strength, increased edema, and pain.   ACTIVITY LIMITATIONS: carrying, lifting, bending, sitting, squatting, and stairs  PARTICIPATION LIMITATIONS: cleaning, laundry, community activity, and yard work  PERSONAL FACTORS: Age and 1 comorbidity: Diabetic neuropathy are also affecting patient's functional outcome.   REHAB POTENTIAL: Excellent  CLINICAL DECISION MAKING: Stable/uncomplicated  EVALUATION COMPLEXITY: Low   GOALS: Goals reviewed with patient? Yes  SHORT TERM GOALS: Target date: 06/10/24 Patient will be compliant with HEP Baseline: Goal status: INITIAL  2.  Patient will report no pain when riding his stationary bike daily Baseline:  Goal status: INITIAL  LONG TERM GOALS: Target date: 07/08/24  Patient will be able to bike 6 miles on his stationary bike pain free with the seat at it's regular height  Baseline:  Goal status: INITIAL  2.  Patient will improve bilateral SLS to at least 5 seconds without UE support  Baseline:  Goal status: INITIAL  3.  Patient  will improve his LEFS score by at least 9 points to demonstrate functional improvement  Baseline: 56/80 Goal status: INITIAL  4.  Patient will improve right knee flexion AROM to be at least 135 to be equal bilaterally  Baseline: 121 Goal status: INITIAL  PLAN:  PT FREQUENCY: 2x/week  PT DURATION: 6 weeks  PLANNED INTERVENTIONS: 97164- PT Re-evaluation, 97110-Therapeutic exercises, 97530- Therapeutic activity, 97112- Neuromuscular re-education, 97535- Self Care, 02859- Manual therapy, [id number]- Gait training, [phone]- Electrical stimulation (unattended), [phone]- Electrical stimulation (manual), 20560 (1-2 muscles), 20561 (3+ muscles)- Dry Needling, Patient/Family education, Balance training, Taping, Manual lymph drainage, Scar mobilization, and Cryotherapy  PLAN FOR NEXT SESSION: Dynamic balance exercises, mini squats, quad stretch, functional LE strengthening  Mliss Cummins, PT 06/03/24 11:54 AM

## 2024-06-03 ENCOUNTER — Ambulatory Visit: Admitting: Physical Therapy

## 2024-06-03 ENCOUNTER — Encounter: Payer: Self-pay | Admitting: Physical Therapy

## 2024-06-03 DIAGNOSIS — M25661 Stiffness of right knee, not elsewhere classified: Secondary | ICD-10-CM

## 2024-06-03 DIAGNOSIS — M25561 Pain in right knee: Secondary | ICD-10-CM | POA: Diagnosis not present

## 2024-06-03 DIAGNOSIS — R2689 Other abnormalities of gait and mobility: Secondary | ICD-10-CM

## 2024-06-06 ENCOUNTER — Encounter: Payer: Self-pay | Admitting: Rehabilitation

## 2024-06-06 ENCOUNTER — Ambulatory Visit: Admitting: Rehabilitation

## 2024-06-06 DIAGNOSIS — M25661 Stiffness of right knee, not elsewhere classified: Secondary | ICD-10-CM

## 2024-06-06 DIAGNOSIS — M25561 Pain in right knee: Secondary | ICD-10-CM

## 2024-06-06 DIAGNOSIS — R2689 Other abnormalities of gait and mobility: Secondary | ICD-10-CM

## 2024-06-06 NOTE — Therapy (Signed)
 OUTPATIENT PHYSICAL THERAPY LOWER EXTREMITY TREATMENT   Patient Name: Fernando Taylor MRN: [id number] DOB:1941-05-07, 83 y.o., male Today's Date: 06/06/2024  END OF SESSION:  PT End of Session - 06/06/24 0854     Visit Number 3    Number of Visits 13    Date for Recertification  07/08/24    PT Start Time 0900    PT Stop Time 0940    PT Time Calculation (min) 40 min    Activity Tolerance Patient tolerated treatment well    Behavior During Therapy WFL for tasks assessed/performed           Past Medical History:  Diagnosis Date   Allergies    Diabetes mellitus (HCC)    History of prostate cancer 2010   NO RADIATION   History of tremor    NOT HEN RESTING, MOSTLY INTENTION TREMOR AND AT A CERTAIN ANGLE, ABLE TO WRITE. HAS NOT HAD MORE EVALUATION, WAS BEING OBSERVED.   HLD (hyperlipidemia)    Hx of atrial fibrillation without current medication 2014   S/P ABLATION IN [place],    Hypertension    Paroxysmal atrial fibrillation (HCC)    Thrombocytopenia    Tremor    Past Surgical History:  Procedure Laterality Date   ATRIAL FIBRILLATION ABLATION  2014   S/P   COLONOSCOPY     DUE NOW, APRIL 2020   KIDNEY STONE SURGERY     PROSTATECTOMY  2010   Patient Active Problem List   Diagnosis Date Noted   Peripheral arterial disease 09/09/2022   Persistent atrial fibrillation (HCC) 07/25/2019   Carotid artery disease 07/25/2019   Hyperlipidemia 07/25/2019   Pain due to onychomycosis of toenails of both feet 07/19/2019   Diabetic neuropathy (HCC) 07/19/2019    PCP: Regino Slater, MD  REFERRING PROVIDER: Melodi Lerner, MD  REFERRING DIAG:  Z51.89 (ICD-10-CM) - Encounter for other specified aftercare    THERAPY DIAG:  Acute pain of right knee  Stiffness of right knee, not elsewhere classified  Other abnormalities of gait and mobility  Rationale for Evaluation and Treatment: Rehabilitation  ONSET DATE: 04/16/24  SUBJECTIVE:   SUBJECTIVE STATEMENT: Its only every  once in a while that I get a twinge in the knee.     EVAL: Patient underwent surgery on 04/16/24. Patient repots he initially injured his knee after falling into his his attic when he missed a step. He had arthroscopic surgery in the Lt knee 10 years ago and he recovered well from that. Patient reports he is still unable to squat and feels like his knee is still swollen. He uses ice 2-3 times a day. Patient has a stationary bike and he was doing 6 miles a day but has not been able to do this since surgery; he is currently only able to do 3 miles. Patient states that his diabetic neuropathy is causing his balance to be worse.   PERTINENT HISTORY: Underwent arthroscopy with meniscal debridement following complex medial meniscal tear on Rt knee. Diabetic neuropathy PAIN:  Are you having pain? Yes: NPRS scale: 2/10 Pain location: Inner portion of Rt knee Pain description: Dull Aggravating factors: Squatting Relieving factors: Ice helps numb it, Advil PRN  PRECAUTIONS: None  RED FLAGS: None   WEIGHT BEARING RESTRICTIONS: No  FALLS:  Has patient fallen in last 6 months? Yes. Number of falls Tripped going into his attic which caused the original injury  LIVING ENVIRONMENT: Lives with: lives with their spouse Lives in: House/apartment Stairs: Yes but does not  use them  OCCUPATION: Retired  PLOF: Independent  PATIENT GOALS: Wants to be able to walk normally & get back to biking  NEXT MD VISIT: 07/05/23  OBJECTIVE:  Note: Objective measures were completed at Evaluation unless otherwise noted.  PATIENT SURVEYS:  LEFS  Extreme difficulty/unable (0), Quite a bit of difficulty (1), Moderate difficulty (2), Little difficulty (3), No difficulty (4) Survey date:  05/27/24  Any of your usual work, housework or school activities 4  2. Usual hobbies, recreational or sporting activities 4  3. Getting into/out of the bath 4  4. Walking between rooms 4  5. Putting on socks/shoes 3  6. Squatting   1  7. Lifting an object, like a bag of groceries from the floor 3  8. Performing light activities around your home 4  9. Performing heavy activities around your home 3  10. Getting into/out of a car 2  11. Walking 2 blocks 3  12. Walking 1 mile 3  13. Going up/down 10 stairs (1 flight) 3  14. Standing for 1 hour 3  15.  sitting for 1 hour 4  16. Running on even ground 1  17. Running on uneven ground 1  18. Making sharp turns while running fast 1  19. Hopping  2  20. Rolling over in bed 3  Score total:  56/80     COGNITION: Overall cognitive status: Within functional limits for tasks assessed    EDEMA: Increased edema on the Rt knee  POSTURE: rounded shoulders and forward head  PALPATION: TTP at Rt mid and distal quadriceps and medial aspect of Rt knee  LOWER EXTREMITY MMT:  MMT Right eval Left eval  Hip flexion 4 pain in hip 5  Hip extension    Hip abduction    Hip adduction    Hip internal rotation    Hip external rotation    Knee flexion 4+ 4+  Knee extension 4+ 5  Ankle dorsiflexion 5 5  Ankle plantarflexion 5 5  Ankle inversion    Ankle eversion     (Blank rows = not tested)  LOWER EXTREMITY ROM:  ROM Right eval Left eval  Hip flexion    Hip extension    Hip abduction    Hip adduction    Hip internal rotation    Hip external rotation    Knee flexion 121 135  Knee extension 10 6  Ankle dorsiflexion    Ankle plantarflexion    Ankle inversion    Ankle eversion     (Blank rows = not tested)  FUNCTIONAL TESTS:  5 times sit to stand: 13.24 sec - increased Rt knee pain SLS: unable to perform bil without UE support  Tandem balance: Rt forward 6 sec; Lt forward 2 sec  GAIT: Distance walked: 90 feet Assistive device utilized: None Level of assistance: Complete Independence Comments: Decreased stance time on Rt leg with compensation by leaning to the Lt  TREATMENT DATE:  06/06/24 Bike L 3 x 6 min PT present to discuss status HS stretch at stairs 2x30 sec Quad stretch at stairs using chair 2x30 sec Standing heel raise 2 x 10 Standing March x 20 B on foam with hand hold  Mini squats x 20 on foam Wall slides x 15 pain free range Supine quad set x 10  Seated LAQ x 10 2# SL stance practice 5xmax with hand hold as needed in bars Standing 3 way hip 2# x 10 each  06/03/24 Bike L 3 x 4 min PT present to discuss status HS stretch at stairs 2x30 sec Quad stretch at stairs using chair 2x30 sec Standing heel raise 2 x 10 Standing March x 20 B Mini squats x 20 Wall slides x 15 pain free range Supine quad set x 10  Supine heel slide review  Seated LAQ x 10 Seated HS stretch with strap R Semi romberg stance with head turns 1 x 5 B 3 way step outs x 5 B   05/27/24  Eval, POC, HEP  PATIENT EDUCATION:  Education details: POC, HEP Person educated: Patient Education method: Chief Technology [employer] Education comprehension: verbalized understanding  HOME EXERCISE PROGRAM: Access Code: [email or web address] URL: [email or web address] Date: 06/03/2024 Prepared by: Mliss  Exercises - Seated Hamstring Stretch with Strap  - 2 x daily - 7 x weekly - 1 sets - 3 reps - 60 seconds hold - Wall Quarter Squat  - 1 x daily - 3 x weekly - 2 sets - 10 reps - Standing Romberg to 1/2 Tandem Stance  - 2 x daily - 7 x weekly - 1 sets - 5 reps - max hold  ASSESSMENT:  CLINICAL IMPRESSION: He tolerated all exercises well today. Added 2# to strengthening exercises.  Good demonstration of all exercises with minimal cueing required. He is very tight in his HS and quads.   Eval: Patient is an 83 y.o. male who was seen today for physical therapy evaluation and treatment for decreased knee strength/ROM secondary to arthroscopic debridement on 04/16/24. Pertinent PMH includes diabetic neuropathy which negatively impacts his  balance. Patient reports he is currently unable to squat and ride his stationary bike 6 miles per day since surgery. Patient has increased edema observed in the right knee and reports tightness due to the edema with knee flexion AROM. Patient's incision sites are well healed with no increased redness or warmth noted. All red flags are ruled out. Patient has functional weakness and ROM deficits where he is unable to squat down and has difficulty getting into/out of his car. The patient has impaired static balance, he is unable to perform SLS bilaterally and can only hold a tandem stance for 2 seconds with the left leg forward and 6 seconds with the right leg forward. He has decreased stance time on the Rt leg with a lateral lean to the left side observed during gait. Patient would benefit from continued skilled physical therapy to address the following: impaired balance, decreased functional mobility/strength, edema, and pain.   OBJECTIVE IMPAIRMENTS: Abnormal gait, decreased activity tolerance, decreased balance, decreased mobility, difficulty walking, decreased ROM, decreased strength, increased edema, and pain.   ACTIVITY LIMITATIONS: carrying, lifting, bending, sitting, squatting, and stairs  PARTICIPATION LIMITATIONS: cleaning, laundry, community activity, and yard work  PERSONAL FACTORS: Age and 1 comorbidity: Diabetic neuropathy are also affecting patient's functional outcome.   REHAB POTENTIAL: Excellent  CLINICAL DECISION MAKING: Stable/uncomplicated  EVALUATION COMPLEXITY: Low   GOALS: Goals reviewed with patient? Yes  SHORT TERM GOALS: Target date: 06/10/24 Patient will be compliant with HEP Baseline: Goal status: INITIAL  2.  Patient will report no pain when riding his stationary bike daily Baseline:  Goal status: INITIAL  LONG TERM GOALS: Target date: 07/08/24  Patient will be able to bike 6 miles on his stationary bike pain free with the seat at it's regular height   Baseline:  Goal status: INITIAL  2.  Patient will improve bilateral SLS to at least 5 seconds without UE support  Baseline:  Goal status: INITIAL  3.  Patient will improve his LEFS score by at least 9 points to demonstrate functional improvement  Baseline: 56/80 Goal status: INITIAL  4.  Patient will improve right knee flexion AROM to be at least 135 to be equal bilaterally  Baseline: 121 Goal status: INITIAL  PLAN:  PT FREQUENCY: 2x/week  PT DURATION: 6 weeks  PLANNED INTERVENTIONS: 97164- PT Re-evaluation, 97110-Therapeutic exercises, 97530- Therapeutic activity, 97112- Neuromuscular re-education, 97535- Self Care, 02859- Manual therapy, [phone]- Gait training, [phone]- Electrical stimulation (unattended), [phone]- Electrical stimulation (manual), 20560 (1-2 muscles), 20561 (3+ muscles)- Dry Needling, Patient/Family education, Balance training, Taping, Manual lymph drainage, Scar mobilization, and Cryotherapy  PLAN FOR NEXT SESSION: Dynamic balance exercises, mini squats, quad stretch, functional LE strengthening  Saddie Raw, PT 06/06/2024, 9:44 AM   06/06/2024 9:44 AM

## 2024-06-11 ENCOUNTER — Ambulatory Visit

## 2024-06-11 DIAGNOSIS — R293 Abnormal posture: Secondary | ICD-10-CM

## 2024-06-11 DIAGNOSIS — M25561 Pain in right knee: Secondary | ICD-10-CM

## 2024-06-11 DIAGNOSIS — R252 Cramp and spasm: Secondary | ICD-10-CM

## 2024-06-11 DIAGNOSIS — R262 Difficulty in walking, not elsewhere classified: Secondary | ICD-10-CM

## 2024-06-11 DIAGNOSIS — M6281 Muscle weakness (generalized): Secondary | ICD-10-CM

## 2024-06-11 DIAGNOSIS — M25661 Stiffness of right knee, not elsewhere classified: Secondary | ICD-10-CM

## 2024-06-11 NOTE — Therapy (Signed)
 OUTPATIENT PHYSICAL THERAPY LOWER EXTREMITY TREATMENT   Patient Name: Fernando Taylor MRN: [id number] DOB:[birth year], 83 y.o., male Today's Date: 06/11/2024  END OF SESSION:  PT End of Session - 06/11/24 1535     Visit Number 4    Number of Visits 13    Date for Recertification  07/08/24    Authorization Type Medicare    PT Start Time 1535    PT Stop Time 1615    PT Time Calculation (min) 40 min    Activity Tolerance Patient tolerated treatment well    Behavior During Therapy WFL for tasks assessed/performed           Past Medical History:  Diagnosis Date   Allergies    Diabetes mellitus (HCC)    History of prostate cancer 2010   NO RADIATION   History of tremor    NOT HEN RESTING, MOSTLY INTENTION TREMOR AND AT A CERTAIN ANGLE, ABLE TO WRITE. HAS NOT HAD MORE EVALUATION, WAS BEING OBSERVED.   HLD (hyperlipidemia)    Hx of atrial fibrillation without current medication 2014   S/P ABLATION IN [place],    Hypertension    Paroxysmal atrial fibrillation (HCC)    Thrombocytopenia    Tremor    Past Surgical History:  Procedure Laterality Date   ATRIAL FIBRILLATION ABLATION  2014   S/P   COLONOSCOPY     DUE NOW, APRIL 2020   KIDNEY STONE SURGERY     PROSTATECTOMY  2010   Patient Active Problem List   Diagnosis Date Noted   Peripheral arterial disease 09/09/2022   Persistent atrial fibrillation (HCC) 07/25/2019   Carotid artery disease 07/25/2019   Hyperlipidemia 07/25/2019   Pain due to onychomycosis of toenails of both feet 07/19/2019   Diabetic neuropathy (HCC) 07/19/2019    PCP: Regino Slater, MD  REFERRING PROVIDER: Melodi Lerner, MD  REFERRING DIAG:  Z51.89 (ICD-10-CM) - Encounter for other specified aftercare    THERAPY DIAG:  Muscle weakness (generalized)  Cramp and spasm  Difficulty in walking, not elsewhere classified  Acute pain of right knee  Stiffness of right knee, not elsewhere classified  Abnormal posture  Rationale for Evaluation  and Treatment: Rehabilitation  ONSET DATE: 04/16/24  SUBJECTIVE:   SUBJECTIVE STATEMENT: Patient reports no pain today.  Just a little stiff when trying to bend.     EVAL: Patient underwent surgery on 04/16/24. Patient repots he initially injured his knee after falling into his his attic when he missed a step. He had arthroscopic surgery in the Lt knee 10 years ago and he recovered well from that. Patient reports he is still unable to squat and feels like his knee is still swollen. He uses ice 2-3 times a day. Patient has a stationary bike and he was doing 6 miles a day but has not been able to do this since surgery; he is currently only able to do 3 miles. Patient states that his diabetic neuropathy is causing his balance to be worse.   PERTINENT HISTORY: Underwent arthroscopy with meniscal debridement following complex medial meniscal tear on Rt knee. Diabetic neuropathy PAIN:  Are you having pain? Yes: NPRS scale: 2/10 Pain location: Inner portion of Rt knee Pain description: Dull Aggravating factors: Squatting Relieving factors: Ice helps numb it, Advil PRN  PRECAUTIONS: None  RED FLAGS: None   WEIGHT BEARING RESTRICTIONS: No  FALLS:  Has patient fallen in last 6 months? Yes. Number of falls Tripped going into his attic which caused the original injury  LIVING ENVIRONMENT: Lives with: lives with their spouse Lives in: House/apartment Stairs: Yes but does not use them  OCCUPATION: Retired  PLOF: Independent  PATIENT GOALS: Wants to be able to walk normally & get back to biking  NEXT MD VISIT: 07/05/23  OBJECTIVE:  Note: Objective measures were completed at Evaluation unless otherwise noted.  PATIENT SURVEYS:  LEFS  Extreme difficulty/unable (0), Quite a bit of difficulty (1), Moderate difficulty (2), Little difficulty (3), No difficulty (4) Survey date:  05/27/24  Any of your usual work, housework or school activities 4  2. Usual hobbies, recreational or sporting  activities 4  3. Getting into/out of the bath 4  4. Walking between rooms 4  5. Putting on socks/shoes 3  6. Squatting  1  7. Lifting an object, like a bag of groceries from the floor 3  8. Performing light activities around your home 4  9. Performing heavy activities around your home 3  10. Getting into/out of a car 2  11. Walking 2 blocks 3  12. Walking 1 mile 3  13. Going up/down 10 stairs (1 flight) 3  14. Standing for 1 hour 3  15.  sitting for 1 hour 4  16. Running on even ground 1  17. Running on uneven ground 1  18. Making sharp turns while running fast 1  19. Hopping  2  20. Rolling over in bed 3  Score total:  56/80     COGNITION: Overall cognitive status: Within functional limits for tasks assessed    EDEMA: Increased edema on the Rt knee  POSTURE: rounded shoulders and forward head  PALPATION: TTP at Rt mid and distal quadriceps and medial aspect of Rt knee  LOWER EXTREMITY MMT:  MMT Right eval Left eval  Hip flexion 4 pain in hip 5  Hip extension    Hip abduction    Hip adduction    Hip internal rotation    Hip external rotation    Knee flexion 4+ 4+  Knee extension 4+ 5  Ankle dorsiflexion 5 5  Ankle plantarflexion 5 5  Ankle inversion    Ankle eversion     (Blank rows = not tested)  LOWER EXTREMITY ROM:  ROM Right eval Left eval  Hip flexion    Hip extension    Hip abduction    Hip adduction    Hip internal rotation    Hip external rotation    Knee flexion 121 135  Knee extension 10 6  Ankle dorsiflexion    Ankle plantarflexion    Ankle inversion    Ankle eversion     (Blank rows = not tested)  FUNCTIONAL TESTS:  5 times sit to stand: 13.24 sec - increased Rt knee pain SLS: unable to perform bil without UE support  Tandem balance: Rt forward 6 sec; Lt forward 2 sec  GAIT: Distance walked: 90 feet Assistive device utilized: None Level of assistance: Complete Independence Comments: Decreased stance time on Rt leg with  compensation by leaning to the Lt  TREATMENT DATE:  06/06/24 Bike L 5 x 7 min PT present to discuss status HS stretch at stairs 2x30 sec right Quad stretch at stairs using chair 2x30 sec both (had planned on doing just right but patient wanted to do both) Standing heel raise x 20 Standing March x 20 B on foam without hand hold for majority of steps (reached for rail 3 times to steady and then let go) Mini squats x 20 on foam Wall slides x 15 pain free range Squat hold x 5 holding 10 sec Lunge to BOSU with balance pole opposite of lead leg x 10 each LE fwd Lunge to BOSU lateral without UE support x 10 (gave just lateral lunge but patient wanted to do 3 way star type drill with BOSU on just the lateral step Sit to stand x 10 Squat to table x 10  Supine quad set x 10    06/06/24 Bike L 3 x 6 min PT present to discuss status HS stretch at stairs 2x30 sec Quad stretch at stairs using chair 2x30 sec Standing heel raise 2 x 10 Standing March x 20 B on foam with hand hold  Mini squats x 20 on foam Wall slides x 15 pain free range Supine quad set x 10  Seated LAQ x 10 2# SL stance practice 5xmax with hand hold as needed in bars Standing 3 way hip 2# x 10 each  06/03/24 Bike L 3 x 4 min PT present to discuss status HS stretch at stairs 2x30 sec Quad stretch at stairs using chair 2x30 sec Standing heel raise 2 x 10 Standing March x 20 B Mini squats x 20 Wall slides x 15 pain free range Supine quad set x 10  Supine heel slide review  Seated LAQ x 10 Seated HS stretch with strap R Semi romberg stance with head turns 1 x 5 B 3 way step outs x 5 B   05/27/24  Eval, POC, HEP  PATIENT EDUCATION:  Education details: POC, HEP Person educated: Patient Education method: Chief Technology Officer Education comprehension: verbalized understanding  HOME  EXERCISE PROGRAM: Access Code: [email or web address] URL: [email or web address] Date: 06/03/2024 Prepared by: Mliss  Exercises - Seated Hamstring Stretch with Strap  - 2 x daily - 7 x weekly - 1 sets - 3 reps - 60 seconds hold - Wall Quarter Squat  - 1 x daily - 3 x weekly - 2 sets - 10 reps - Standing Romberg to 1/2 Tandem Stance  - 2 x daily - 7 x weekly - 1 sets - 5 reps - max hold  ASSESSMENT:  CLINICAL IMPRESSION: Patient had some minor lob's with several activities today but has good protective responses.  He tends to self direct wanting to do some exercises he is already doing at home, which interferes with ability to introduce planned activities for the session.  He experienced a little bit of tightness when doing the lunges when he switched to his 3 way lunge.  Overall, doing well.  He would benefit from continued skilled PT to progress toward goals stated below.    Eval: Patient is an 83 y.o. male who was seen today for physical therapy evaluation and treatment for decreased knee strength/ROM secondary to arthroscopic debridement on 04/16/24. Pertinent PMH includes diabetic neuropathy which negatively impacts his balance. Patient reports he is currently unable to squat and ride his stationary bike 6 miles per day since surgery. Patient has increased edema observed in the right knee and reports tightness due  to the edema with knee flexion AROM. Patient's incision sites are well healed with no increased redness or warmth noted. All red flags are ruled out. Patient has functional weakness and ROM deficits where he is unable to squat down and has difficulty getting into/out of his car. The patient has impaired static balance, he is unable to perform SLS bilaterally and can only hold a tandem stance for 2 seconds with the left leg forward and 6 seconds with the right leg forward. He has decreased stance time on the Rt leg with a lateral lean to the left side observed during gait. Patient would  benefit from continued skilled physical therapy to address the following: impaired balance, decreased functional mobility/strength, edema, and pain.   OBJECTIVE IMPAIRMENTS: Abnormal gait, decreased activity tolerance, decreased balance, decreased mobility, difficulty walking, decreased ROM, decreased strength, increased edema, and pain.   ACTIVITY LIMITATIONS: carrying, lifting, bending, sitting, squatting, and stairs  PARTICIPATION LIMITATIONS: cleaning, laundry, community activity, and yard work  PERSONAL FACTORS: Age and 1 comorbidity: Diabetic neuropathy are also affecting patient's functional outcome.   REHAB POTENTIAL: Excellent  CLINICAL DECISION MAKING: Stable/uncomplicated  EVALUATION COMPLEXITY: Low   GOALS: Goals reviewed with patient? Yes  SHORT TERM GOALS: Target date: 06/10/24 Patient will be compliant with HEP Baseline: Goal status: MET 06/11/24  2.  Patient will report no pain when riding his stationary bike daily Baseline:  Goal status: MET 06/11/24  LONG TERM GOALS: Target date: 07/08/24  Patient will be able to bike 6 miles on his stationary bike pain free with the seat at it's regular height  Baseline:  Goal status: In progress  2.  Patient will improve bilateral SLS to at least 5 seconds without UE support  Baseline:  Goal status: In Progress  3.  Patient will improve his LEFS score by at least 9 points to demonstrate functional improvement  Baseline: 56/80 Goal status: INITIAL  4.  Patient will improve right knee flexion AROM to be at least 135 to be equal bilaterally  Baseline: 121 Goal status: INITIAL  PLAN:  PT FREQUENCY: 2x/week  PT DURATION: 6 weeks  PLANNED INTERVENTIONS: 97164- PT Re-evaluation, 97110-Therapeutic exercises, 97530- Therapeutic activity, [id number]- Neuromuscular re-education, 97535- Self Care, [id number]- Manual therapy, [id number]- Gait training, [phone]- Electrical stimulation (unattended), [phone]- Electrical stimulation (manual),  20560 (1-2 muscles), 20561 (3+ muscles)- Dry Needling, Patient/Family education, Balance training, Taping, Manual lymph drainage, Scar mobilization, and Cryotherapy  PLAN FOR NEXT SESSION: Dynamic balance exercises, mini squats, quad stretch, functional LE strengthening  Amey Hossain B. Vadhir Mcnay, PT 06/11/2024 5:08 PM [facility] [address], [address] [place], [place] [address] Phone # [phone] Fax [phone]

## 2024-06-13 ENCOUNTER — Ambulatory Visit: Admitting: Rehabilitation

## 2024-06-13 ENCOUNTER — Encounter: Payer: Self-pay | Admitting: Rehabilitation

## 2024-06-13 DIAGNOSIS — M25661 Stiffness of right knee, not elsewhere classified: Secondary | ICD-10-CM

## 2024-06-13 DIAGNOSIS — M6281 Muscle weakness (generalized): Secondary | ICD-10-CM

## 2024-06-13 DIAGNOSIS — M25561 Pain in right knee: Secondary | ICD-10-CM | POA: Diagnosis not present

## 2024-06-13 DIAGNOSIS — R252 Cramp and spasm: Secondary | ICD-10-CM

## 2024-06-13 DIAGNOSIS — R262 Difficulty in walking, not elsewhere classified: Secondary | ICD-10-CM

## 2024-06-13 NOTE — Therapy (Signed)
 OUTPATIENT PHYSICAL THERAPY LOWER EXTREMITY TREATMENT   Patient Name: Fernando Taylor MRN: [id number] DOB:12/17/1940, 83 y.o., male Today's Date: 06/13/2024  END OF SESSION:  PT End of Session - 06/13/24 1154     Visit Number 5    Number of Visits 13    Date for Recertification  07/08/24    PT Start Time 1200    PT Stop Time 1240    PT Time Calculation (min) 40 min    Activity Tolerance Patient tolerated treatment well    Behavior During Therapy WFL for tasks assessed/performed           Past Medical History:  Diagnosis Date   Allergies    Diabetes mellitus (HCC)    History of prostate cancer 2010   NO RADIATION   History of tremor    NOT HEN RESTING, MOSTLY INTENTION TREMOR AND AT A CERTAIN ANGLE, ABLE TO WRITE. HAS NOT HAD MORE EVALUATION, WAS BEING OBSERVED.   HLD (hyperlipidemia)    Hx of atrial fibrillation without current medication 2014   S/P ABLATION IN [place],    Hypertension    Paroxysmal atrial fibrillation (HCC)    Thrombocytopenia    Tremor    Past Surgical History:  Procedure Laterality Date   ATRIAL FIBRILLATION ABLATION  2014   S/P   COLONOSCOPY     DUE NOW, APRIL 2020   KIDNEY STONE SURGERY     PROSTATECTOMY  2010   Patient Active Problem List   Diagnosis Date Noted   Peripheral arterial disease 09/09/2022   Persistent atrial fibrillation (HCC) 07/25/2019   Carotid artery disease 07/25/2019   Hyperlipidemia 07/25/2019   Pain due to onychomycosis of toenails of both feet 07/19/2019   Diabetic neuropathy (HCC) 07/19/2019    PCP: Regino Slater, MD  REFERRING PROVIDER: Melodi Lerner, MD  REFERRING DIAG:  Z51.89 (ICD-10-CM) - Encounter for other specified aftercare    THERAPY DIAG:  Muscle weakness (generalized)  Cramp and spasm  Difficulty in walking, not elsewhere classified  Acute pain of right knee  Stiffness of right knee, not elsewhere classified  Rationale for Evaluation and Treatment: Rehabilitation  ONSET DATE:  04/16/24  SUBJECTIVE:   SUBJECTIVE STATEMENT:   I'm doing better.  It only hurts when it gets caught a little bit    EVAL: Patient underwent surgery on 04/16/24. Patient repots he initially injured his knee after falling into his his attic when he missed a step. He had arthroscopic surgery in the Lt knee 10 years ago and he recovered well from that. Patient reports he is still unable to squat and feels like his knee is still swollen. He uses ice 2-3 times a day. Patient has a stationary bike and he was doing 6 miles a day but has not been able to do this since surgery; he is currently only able to do 3 miles. Patient states that his diabetic neuropathy is causing his balance to be worse.   PERTINENT HISTORY: Underwent arthroscopy with meniscal debridement following complex medial meniscal tear on Rt knee. Diabetic neuropathy  PAIN:  Are you having pain? Yes: NPRS scale: 1/10 Pain location: Inner portion of Rt knee Pain description: Dull Aggravating factors: Squatting Relieving factors: Ice helps numb it, Advil PRN  PRECAUTIONS: None  RED FLAGS: None   WEIGHT BEARING RESTRICTIONS: No  FALLS:  Has patient fallen in last 6 months? Yes. Number of falls Tripped going into his attic which caused the original injury  LIVING ENVIRONMENT: Lives with: lives with their  spouse Lives in: House/apartment Stairs: Yes but does not use them  OCCUPATION: Retired  PLOF: Independent  PATIENT GOALS: Wants to be able to walk normally & get back to biking  NEXT MD VISIT: 07/05/23  OBJECTIVE:  Note: Objective measures were completed at Evaluation unless otherwise noted.  PATIENT SURVEYS:  LEFS  Extreme difficulty/unable (0), Quite a bit of difficulty (1), Moderate difficulty (2), Little difficulty (3), No difficulty (4) Survey date:  05/27/24  Any of your usual work, housework or school activities 4  2. Usual hobbies, recreational or sporting activities 4  3. Getting into/out of the bath 4   4. Walking between rooms 4  5. Putting on socks/shoes 3  6. Squatting  1  7. Lifting an object, like a bag of groceries from the floor 3  8. Performing light activities around your home 4  9. Performing heavy activities around your home 3  10. Getting into/out of a car 2  11. Walking 2 blocks 3  12. Walking 1 mile 3  13. Going up/down 10 stairs (1 flight) 3  14. Standing for 1 hour 3  15.  sitting for 1 hour 4  16. Running on even ground 1  17. Running on uneven ground 1  18. Making sharp turns while running fast 1  19. Hopping  2  20. Rolling over in bed 3  Score total:  56/80     COGNITION: Overall cognitive status: Within functional limits for tasks assessed    EDEMA: Increased edema on the Rt knee  POSTURE: rounded shoulders and forward head  PALPATION: TTP at Rt mid and distal quadriceps and medial aspect of Rt knee  LOWER EXTREMITY MMT:  MMT Right eval Left eval  Hip flexion 4 pain in hip 5  Hip extension    Hip abduction    Hip adduction    Hip internal rotation    Hip external rotation    Knee flexion 4+ 4+  Knee extension 4+ 5  Ankle dorsiflexion 5 5  Ankle plantarflexion 5 5  Ankle inversion    Ankle eversion     (Blank rows = not tested)  LOWER EXTREMITY ROM:  ROM Right eval Left eval  Hip flexion    Hip extension    Hip abduction    Hip adduction    Hip internal rotation    Hip external rotation    Knee flexion 121 135  Knee extension 10 6  Ankle dorsiflexion    Ankle plantarflexion    Ankle inversion    Ankle eversion     (Blank rows = not tested)  FUNCTIONAL TESTS:  5 times sit to stand: 13.24 sec - increased Rt knee pain SLS: unable to perform bil without UE support  Tandem balance: Rt forward 6 sec; Lt forward 2 sec  GAIT: Distance walked: 90 feet Assistive device utilized: None Level of assistance: Complete Independence Comments: Decreased stance time on Rt leg with compensation by leaning to the Lt  TREATMENT DATE:  06/13/24 Bike L 5 x 7 min PT present to discuss status HS stretch at stairs 2x30 sec bil Quad stretch at stairs using chair 2x30 sec both  Standing heel raise x 20 Standing March x 20 B at bar on foam Wall slides x 15 pain free range Squat hold x 5 holding 10 sec Lunge to BOSU with one hand on bar x 10 each LE fwd Lunge to BOSU lateral with one hand on bar x 10 Rt only Squat to table with 10# kettle bell x 10 - not able to tap table but could tap table with foam  LAQ 3# 2x10 TKE blue band in standing x 10  06/06/24 Bike L 5 x 7 min PT present to discuss status HS stretch at stairs 2x30 sec right Quad stretch at stairs using chair 2x30 sec both (had planned on doing just right but patient wanted to do both) Standing heel raise x 20 Standing March x 20 B on foam without hand hold for majority of steps (reached for rail 3 times to steady and then let go) Mini squats x 20 on foam Wall slides x 15 pain free range Squat hold x 5 holding 10 sec Lunge to BOSU with balance pole opposite of lead leg x 10 each LE fwd Lunge to BOSU lateral without UE support x 10 (gave just lateral lunge but patient wanted to do 3 way star type drill with BOSU on just the lateral step Sit to stand x 10 Squat to table x 10  Supine quad set x 10    06/06/24 Bike L 3 x 6 min PT present to discuss status HS stretch at stairs 2x30 sec Quad stretch at stairs using chair 2x30 sec Standing heel raise 2 x 10 Standing March x 20 B on foam with hand hold  Mini squats x 20 on foam Wall slides x 15 pain free range Supine quad set x 10  Seated LAQ x 10 2# SL stance practice 5xmax with hand hold as needed in bars Standing 3 way hip 2# x 10 each  PATIENT EDUCATION:  Education details: POC, HEP Person educated: Patient Education method: Chief Technology Officer Education comprehension:  verbalized understanding  HOME EXERCISE PROGRAM: Access Code: [email or web address] URL: [email or web address] Date: 06/03/2024 Prepared by: Mliss  Exercises - Seated Hamstring Stretch with Strap  - 2 x daily - 7 x weekly - 1 sets - 3 reps - 60 seconds hold - Wall Quarter Squat  - 1 x daily - 3 x weekly - 2 sets - 10 reps - Standing Romberg to 1/2 Tandem Stance  - 2 x daily - 7 x weekly - 1 sets - 5 reps - max hold  ASSESSMENT:  CLINICAL IMPRESSION: Pt is doing well with POC.  He has minimal pain but is still stiff and lacking a bit of full extension and full flexion.   Eval: Patient is an 83 y.o. male who was seen today for physical therapy evaluation and treatment for decreased knee strength/ROM secondary to arthroscopic debridement on 04/16/24. Pertinent PMH includes diabetic neuropathy which negatively impacts his balance. Patient reports he is currently unable to squat and ride his stationary bike 6 miles per day since surgery. Patient has increased edema observed in the right knee and reports tightness due to the edema with knee flexion AROM. Patient's incision sites are well healed with no increased redness or warmth noted. All red flags are ruled out. Patient has functional weakness and ROM deficits where  he is unable to squat down and has difficulty getting into/out of his car. The patient has impaired static balance, he is unable to perform SLS bilaterally and can only hold a tandem stance for 2 seconds with the left leg forward and 6 seconds with the right leg forward. He has decreased stance time on the Rt leg with a lateral lean to the left side observed during gait. Patient would benefit from continued skilled physical therapy to address the following: impaired balance, decreased functional mobility/strength, edema, and pain.   OBJECTIVE IMPAIRMENTS: Abnormal gait, decreased activity tolerance, decreased balance, decreased mobility, difficulty walking, decreased ROM, decreased  strength, increased edema, and pain.   ACTIVITY LIMITATIONS: carrying, lifting, bending, sitting, squatting, and stairs  PARTICIPATION LIMITATIONS: cleaning, laundry, community activity, and yard work  PERSONAL FACTORS: Age and 1 comorbidity: Diabetic neuropathy are also affecting patient's functional outcome.   REHAB POTENTIAL: Excellent  CLINICAL DECISION MAKING: Stable/uncomplicated  EVALUATION COMPLEXITY: Low   GOALS: Goals reviewed with patient? Yes  SHORT TERM GOALS: Target date: 06/10/24 Patient will be compliant with HEP Baseline: Goal status: MET 06/11/24  2.  Patient will report no pain when riding his stationary bike daily Baseline:  Goal status: MET 06/11/24  LONG TERM GOALS: Target date: 07/08/24  Patient will be able to bike 6 miles on his stationary bike pain free with the seat at it's regular height  Baseline:  Goal status: In progress  2.  Patient will improve bilateral SLS to at least 5 seconds without UE support  Baseline:  Goal status: In Progress  3.  Patient will improve his LEFS score by at least 9 points to demonstrate functional improvement  Baseline: 56/80 Goal status: INITIAL  4.  Patient will improve right knee flexion AROM to be at least 135 to be equal bilaterally  Baseline: 121 Goal status: INITIAL  PLAN:  PT FREQUENCY: 2x/week  PT DURATION: 6 weeks  PLANNED INTERVENTIONS: 97164- PT Re-evaluation, 97110-Therapeutic exercises, 97530- Therapeutic activity, 97112- Neuromuscular re-education, 97535- Self Care, [id number]- Manual therapy, [id number]- Gait training, [phone]- Electrical stimulation (unattended), [phone]- Electrical stimulation (manual), 20560 (1-2 muscles), 20561 (3+ muscles)- Dry Needling, Patient/Family education, Balance training, Taping, Manual lymph drainage, Scar mobilization, and Cryotherapy  PLAN FOR NEXT SESSION: Dynamic balance exercises, mini squats, quad stretch, functional LE strengthening  Saddie Raw, PT 06/13/2024, 1:05  PM

## 2024-06-18 ENCOUNTER — Ambulatory Visit: Admitting: Rehabilitation

## 2024-06-18 DIAGNOSIS — M25561 Pain in right knee: Secondary | ICD-10-CM | POA: Diagnosis not present

## 2024-06-18 DIAGNOSIS — R252 Cramp and spasm: Secondary | ICD-10-CM

## 2024-06-18 DIAGNOSIS — M6281 Muscle weakness (generalized): Secondary | ICD-10-CM

## 2024-06-18 DIAGNOSIS — R262 Difficulty in walking, not elsewhere classified: Secondary | ICD-10-CM

## 2024-06-18 DIAGNOSIS — M25661 Stiffness of right knee, not elsewhere classified: Secondary | ICD-10-CM

## 2024-06-18 NOTE — Therapy (Signed)
 " OUTPATIENT PHYSICAL THERAPY LOWER EXTREMITY TREATMENT   Patient Name: Fernando Taylor MRN: [id number] DOB:[birth year], 83 y.o., male Today's Date: 06/18/2024  END OF SESSION:  PT End of Session - 06/18/24 1054     Visit Number 6    Number of Visits 13    Date for Recertification  07/08/24    PT Start Time 1000    PT Stop Time 1045    PT Time Calculation (min) 45 min    Activity Tolerance Patient tolerated treatment well    Behavior During Therapy WFL for tasks assessed/performed            Past Medical History:  Diagnosis Date   Allergies    Diabetes mellitus (HCC)    History of prostate cancer 2010   NO RADIATION   History of tremor    NOT HEN RESTING, MOSTLY INTENTION TREMOR AND AT A CERTAIN ANGLE, ABLE TO WRITE. HAS NOT HAD MORE EVALUATION, WAS BEING OBSERVED.   HLD (hyperlipidemia)    Hx of atrial fibrillation without current medication 2014   S/P ABLATION IN [place],    Hypertension    Paroxysmal atrial fibrillation (HCC)    Thrombocytopenia    Tremor    Past Surgical History:  Procedure Laterality Date   ATRIAL FIBRILLATION ABLATION  2014   S/P   COLONOSCOPY     DUE NOW, APRIL 2020   KIDNEY STONE SURGERY     PROSTATECTOMY  2010   Patient Active Problem List   Diagnosis Date Noted   Peripheral arterial disease 09/09/2022   Persistent atrial fibrillation (HCC) 07/25/2019   Carotid artery disease 07/25/2019   Hyperlipidemia 07/25/2019   Pain due to onychomycosis of toenails of both feet 07/19/2019   Diabetic neuropathy (HCC) 07/19/2019    PCP: Regino Slater, MD  REFERRING PROVIDER: Melodi Lerner, MD  REFERRING DIAG:  Z51.89 (ICD-10-CM) - Encounter for other specified aftercare    THERAPY DIAG:  Muscle weakness (generalized)  Cramp and spasm  Difficulty in walking, not elsewhere classified  Acute pain of right knee  Stiffness of right knee, not elsewhere classified  Rationale for Evaluation and Treatment: Rehabilitation  ONSET DATE:  04/16/24  SUBJECTIVE:   SUBJECTIVE STATEMENT:   I'm doing better.  I am probably doing 2.5-74miles on the bike now   EVAL: Patient underwent surgery on 04/16/24. Patient repots he initially injured his knee after falling into his his attic when he missed a step. He had arthroscopic surgery in the Lt knee 10 years ago and he recovered well from that. Patient reports he is still unable to squat and feels like his knee is still swollen. He uses ice 2-3 times a day. Patient has a stationary bike and he was doing 6 miles a day but has not been able to do this since surgery; he is currently only able to do 3 miles. Patient states that his diabetic neuropathy is causing his balance to be worse.   PERTINENT HISTORY: Underwent arthroscopy with meniscal debridement following complex medial meniscal tear on Rt knee. Diabetic neuropathy  PAIN:  Are you having pain? Yes: NPRS scale: 1/10 Pain location: Inner portion of Rt knee Pain description: Dull Aggravating factors: Squatting Relieving factors: Ice helps numb it, Advil PRN  PRECAUTIONS: None  RED FLAGS: None   WEIGHT BEARING RESTRICTIONS: No  FALLS:  Has patient fallen in last 6 months? Yes. Number of falls Tripped going into his attic which caused the original injury  LIVING ENVIRONMENT: Lives with: lives with their  spouse Lives in: House/apartment Stairs: Yes but does not use them  OCCUPATION: Retired  PLOF: Independent  PATIENT GOALS: Wants to be able to walk normally & get back to biking  NEXT MD VISIT: 07/05/23  OBJECTIVE:  Note: Objective measures were completed at Evaluation unless otherwise noted.  PATIENT SURVEYS:  LEFS  Extreme difficulty/unable (0), Quite a bit of difficulty (1), Moderate difficulty (2), Little difficulty (3), No difficulty (4) Survey date:  05/27/24  Any of your usual work, housework or school activities 4  2. Usual hobbies, recreational or sporting activities 4  3. Getting into/out of the bath 4   4. Walking between rooms 4  5. Putting on socks/shoes 3  6. Squatting  1  7. Lifting an object, like a bag of groceries from the floor 3  8. Performing light activities around your home 4  9. Performing heavy activities around your home 3  10. Getting into/out of a car 2  11. Walking 2 blocks 3  12. Walking 1 mile 3  13. Going up/down 10 stairs (1 flight) 3  14. Standing for 1 hour 3  15.  sitting for 1 hour 4  16. Running on even ground 1  17. Running on uneven ground 1  18. Making sharp turns while running fast 1  19. Hopping  2  20. Rolling over in bed 3  Score total:  56/80     COGNITION: Overall cognitive status: Within functional limits for tasks assessed    EDEMA: Increased edema on the Rt knee  POSTURE: rounded shoulders and forward head  PALPATION: TTP at Rt mid and distal quadriceps and medial aspect of Rt knee  LOWER EXTREMITY MMT:  MMT Right eval Left eval  Hip flexion 4 pain in hip 5  Hip extension    Hip abduction    Hip adduction    Hip internal rotation    Hip external rotation    Knee flexion 4+ 4+  Knee extension 4+ 5  Ankle dorsiflexion 5 5  Ankle plantarflexion 5 5  Ankle inversion    Ankle eversion     (Blank rows = not tested)  LOWER EXTREMITY ROM:  ROM Right eval Left eval  Hip flexion    Hip extension    Hip abduction    Hip adduction    Hip internal rotation    Hip external rotation    Knee flexion 121 135  Knee extension 10 6  Ankle dorsiflexion    Ankle plantarflexion    Ankle inversion    Ankle eversion     (Blank rows = not tested)  FUNCTIONAL TESTS:  5 times sit to stand: 13.24 sec - increased Rt knee pain SLS: unable to perform bil without UE support  Tandem balance: Rt forward 6 sec; Lt forward 2 sec  GAIT: Distance walked: 90 feet Assistive device utilized: None Level of assistance: Complete Independence Comments: Decreased stance time on Rt leg with compensation by leaning to the Lt  TREATMENT DATE:  06/13/24 Bike L 5 x 8 min PT present to discuss status HS stretch at stairs 2x30 sec bil Quad stretch at stairs using chair 2x30 sec both  Standing calf stretch x20 bil  Standing heel raise x 20 Standing March x 20 B at bar on foam Squat on purple foam x 20 Lunge to BOSU with one hand on bar x 10 each LE fwd Lunge to BOSU lateral with one hand on bar x 10 bil Leg press seat 7 30# easy, 50# easy and then 70# 2x10 and single leg Rt only 30# x 10 Squat to table with 10# kettle bell  2x 10 - not able to tap table but could tap table with foam  LAQ 4# 2x15 TKE blue band in standing  2x 10  06/06/24 Bike L 5 x 7 min PT present to discuss status HS stretch at stairs 2x30 sec right Quad stretch at stairs using chair 2x30 sec both (had planned on doing just right but patient wanted to do both) Standing heel raise x 20 Standing March x 20 B on foam without hand hold for majority of steps (reached for rail 3 times to steady and then let go) Mini squats x 20 on foam Wall slides x 15 pain free range Squat hold x 5 holding 10 sec Lunge to BOSU with balance pole opposite of lead leg x 10 each LE fwd Lunge to BOSU lateral without UE support x 10 (gave just lateral lunge but patient wanted to do 3 way star type drill with BOSU on just the lateral step Sit to stand x 10 Squat to table x 10  Supine quad set x 10    06/06/24 Bike L 3 x 6 min PT present to discuss status HS stretch at stairs 2x30 sec Quad stretch at stairs using chair 2x30 sec Standing heel raise 2 x 10 Standing March x 20 B on foam with hand hold  Mini squats x 20 on foam Wall slides x 15 pain free range Supine quad set x 10  Seated LAQ x 10 2# SL stance practice 5xmax with hand hold as needed in bars Standing 3 way hip 2# x 10 each  PATIENT EDUCATION:  Education details: POC, HEP Person educated:  Patient Education method: Chief Technology Officer Education comprehension: verbalized understanding  HOME EXERCISE PROGRAM: Access Code: [email or web address] URL: [email or web address] Date: 06/03/2024 Prepared by: Mliss  Exercises - Seated Hamstring Stretch with Strap  - 2 x daily - 7 x weekly - 1 sets - 3 reps - 60 seconds hold - Wall Quarter Squat  - 1 x daily - 3 x weekly - 2 sets - 10 reps - Standing Romberg to 1/2 Tandem Stance  - 2 x daily - 7 x weekly - 1 sets - 5 reps - max hold  ASSESSMENT:  CLINICAL IMPRESSION: Pt is doing well with POC.  He has minimal pain but is still stiff and lacking a bit of full extension and full flexion. Extension difficulty noted mainly with SL leg press today.   Eval: Patient is an 83 y.o. male who was seen today for physical therapy evaluation and treatment for decreased knee strength/ROM secondary to arthroscopic debridement on 04/16/24. Pertinent PMH includes diabetic neuropathy which negatively impacts his balance. Patient reports he is currently unable to squat and ride his stationary bike 6 miles per day since surgery. Patient has increased edema observed in the right knee and reports tightness due to the edema with knee flexion  AROM. Patient's incision sites are well healed with no increased redness or warmth noted. All red flags are ruled out. Patient has functional weakness and ROM deficits where he is unable to squat down and has difficulty getting into/out of his car. The patient has impaired static balance, he is unable to perform SLS bilaterally and can only hold a tandem stance for 2 seconds with the left leg forward and 6 seconds with the right leg forward. He has decreased stance time on the Rt leg with a lateral lean to the left side observed during gait. Patient would benefit from continued skilled physical therapy to address the following: impaired balance, decreased functional mobility/strength, edema, and pain.   OBJECTIVE  IMPAIRMENTS: Abnormal gait, decreased activity tolerance, decreased balance, decreased mobility, difficulty walking, decreased ROM, decreased strength, increased edema, and pain.   ACTIVITY LIMITATIONS: carrying, lifting, bending, sitting, squatting, and stairs  PARTICIPATION LIMITATIONS: cleaning, laundry, community activity, and yard work  PERSONAL FACTORS: Age and 1 comorbidity: Diabetic neuropathy are also affecting patient's functional outcome.   REHAB POTENTIAL: Excellent  CLINICAL DECISION MAKING: Stable/uncomplicated  EVALUATION COMPLEXITY: Low   GOALS: Goals reviewed with patient? Yes  SHORT TERM GOALS: Target date: 06/10/24 Patient will be compliant with HEP Baseline: Goal status: MET 06/11/24  2.  Patient will report no pain when riding his stationary bike daily Baseline:  Goal status: MET 06/11/24  LONG TERM GOALS: Target date: 07/08/24  Patient will be able to bike 6 miles on his stationary bike pain free with the seat at it's regular height  Baseline:  Goal status: In progress  2.  Patient will improve bilateral SLS to at least 5 seconds without UE support  Baseline:  Goal status: In Progress  3.  Patient will improve his LEFS score by at least 9 points to demonstrate functional improvement  Baseline: 56/80 Goal status: INITIAL  4.  Patient will improve right knee flexion AROM to be at least 135 to be equal bilaterally  Baseline: 121 Goal status: INITIAL  PLAN:  PT FREQUENCY: 2x/week  PT DURATION: 6 weeks  PLANNED INTERVENTIONS: 97164- PT Re-evaluation, 97110-Therapeutic exercises, 97530- Therapeutic activity, 97112- Neuromuscular re-education, 97535- Self Care, [id number]- Manual therapy, [id number]- Gait training, [phone]- Electrical stimulation (unattended), [phone]- Electrical stimulation (manual), 20560 (1-2 muscles), 20561 (3+ muscles)- Dry Needling, Patient/Family education, Balance training, Taping, Manual lymph drainage, Scar mobilization, and  Cryotherapy  PLAN FOR NEXT SESSION: Dynamic balance exercises, mini squats, quad stretch, functional LE strengthening  Saddie Raw, PT 06/18/2024, 10:54 AM   "

## 2024-06-25 ENCOUNTER — Ambulatory Visit

## 2024-07-02 ENCOUNTER — Ambulatory Visit: Attending: Orthopedic Surgery | Admitting: Physical Therapy

## 2024-07-02 ENCOUNTER — Encounter: Payer: Self-pay | Admitting: Physical Therapy

## 2024-07-02 DIAGNOSIS — M6281 Muscle weakness (generalized): Secondary | ICD-10-CM | POA: Diagnosis present

## 2024-07-02 DIAGNOSIS — R262 Difficulty in walking, not elsewhere classified: Secondary | ICD-10-CM | POA: Diagnosis present

## 2024-07-02 DIAGNOSIS — M25661 Stiffness of right knee, not elsewhere classified: Secondary | ICD-10-CM | POA: Diagnosis present

## 2024-07-02 DIAGNOSIS — R2689 Other abnormalities of gait and mobility: Secondary | ICD-10-CM | POA: Diagnosis present

## 2024-07-02 DIAGNOSIS — R252 Cramp and spasm: Secondary | ICD-10-CM | POA: Diagnosis present

## 2024-07-02 DIAGNOSIS — R293 Abnormal posture: Secondary | ICD-10-CM | POA: Diagnosis present

## 2024-07-02 DIAGNOSIS — M25561 Pain in right knee: Secondary | ICD-10-CM | POA: Diagnosis present

## 2024-07-02 NOTE — Therapy (Signed)
 " OUTPATIENT PHYSICAL THERAPY LOWER EXTREMITY TREATMENT   Patient Name: Fernando Taylor MRN: [id number] DOB:12-05-1940, 84 y.o., male Today's Date: 07/02/2024  END OF SESSION:  PT End of Session - 07/02/24 1053     Visit Number 7    Number of Visits 13    Date for Recertification  07/08/24    Authorization Type Medicare    PT Start Time 1053    PT Stop Time 1142    PT Time Calculation (min) 49 min    Activity Tolerance Patient tolerated treatment well    Behavior During Therapy WFL for tasks assessed/performed             Past Medical History:  Diagnosis Date   Allergies    Diabetes mellitus (HCC)    History of prostate cancer 2010   NO RADIATION   History of tremor    NOT HEN RESTING, MOSTLY INTENTION TREMOR AND AT A CERTAIN ANGLE, ABLE TO WRITE. HAS NOT HAD MORE EVALUATION, WAS BEING OBSERVED.   HLD (hyperlipidemia)    Hx of atrial fibrillation without current medication 2014   S/P ABLATION IN [place],    Hypertension    Paroxysmal atrial fibrillation (HCC)    Thrombocytopenia    Tremor    Past Surgical History:  Procedure Laterality Date   ATRIAL FIBRILLATION ABLATION  2014   S/P   COLONOSCOPY     DUE NOW, APRIL 2020   KIDNEY STONE SURGERY     PROSTATECTOMY  2010   Patient Active Problem List   Diagnosis Date Noted   Peripheral arterial disease 09/09/2022   Persistent atrial fibrillation (HCC) 07/25/2019   Carotid artery disease 07/25/2019   Hyperlipidemia 07/25/2019   Pain due to onychomycosis of toenails of both feet 07/19/2019   Diabetic neuropathy (HCC) 07/19/2019    PCP: Regino Slater, MD  REFERRING PROVIDER: Melodi Lerner, MD  REFERRING DIAG:  Z51.89 (ICD-10-CM) - Encounter for other specified aftercare    THERAPY DIAG:  Muscle weakness (generalized)  Cramp and spasm  Difficulty in walking, not elsewhere classified  Acute pain of right knee  Stiffness of right knee, not elsewhere classified  Abnormal posture  Other abnormalities of  gait and mobility  Rationale for Evaluation and Treatment: Rehabilitation  ONSET DATE: 04/16/24  SUBJECTIVE:   SUBJECTIVE STATEMENT:  Squats and getting my pants on (lifting the right leg high enough) are still difficult. Still feels tight after biking.   EVAL: Patient underwent surgery on 04/16/24. Patient repots he initially injured his knee after falling into his his attic when he missed a step. He had arthroscopic surgery in the Lt knee 10 years ago and he recovered well from that. Patient reports he is still unable to squat and feels like his knee is still swollen. He uses ice 2-3 times a day. Patient has a stationary bike and he was doing 6 miles a day but has not been able to do this since surgery; he is currently only able to do 3 miles. Patient states that his diabetic neuropathy is causing his balance to be worse.   PERTINENT HISTORY: Underwent arthroscopy with meniscal debridement following complex medial meniscal tear on Rt knee. Diabetic neuropathy  PAIN:  Are you having pain? Yes: NPRS scale: 0/10 Pain location: Inner portion of Rt knee Pain description: Dull Aggravating factors: Squatting Relieving factors: Ice helps numb it, Advil PRN  PRECAUTIONS: None  RED FLAGS: None   WEIGHT BEARING RESTRICTIONS: No  FALLS:  Has patient fallen in last 6  months? Yes. Number of falls Tripped going into his attic which caused the original injury  LIVING ENVIRONMENT: Lives with: lives with their spouse Lives in: House/apartment Stairs: Yes but does not use them  OCCUPATION: Retired  PLOF: Independent  PATIENT GOALS: Wants to be able to walk normally & get back to biking  NEXT MD VISIT: 07/05/23  OBJECTIVE:  Note: Objective measures were completed at Evaluation unless otherwise noted.  PATIENT SURVEYS:  LEFS  Extreme difficulty/unable (0), Quite a bit of difficulty (1), Moderate difficulty (2), Little difficulty (3), No difficulty (4) Survey date:  05/27/24  Any of  your usual work, housework or school activities 4  2. Usual hobbies, recreational or sporting activities 4  3. Getting into/out of the bath 4  4. Walking between rooms 4  5. Putting on socks/shoes 3  6. Squatting  1  7. Lifting an object, like a bag of groceries from the floor 3  8. Performing light activities around your home 4  9. Performing heavy activities around your home 3  10. Getting into/out of a car 2  11. Walking 2 blocks 3  12. Walking 1 mile 3  13. Going up/down 10 stairs (1 flight) 3  14. Standing for 1 hour 3  15.  sitting for 1 hour 4  16. Running on even ground 1  17. Running on uneven ground 1  18. Making sharp turns while running fast 1  19. Hopping  2  20. Rolling over in bed 3  Score total:  56/80     COGNITION: Overall cognitive status: Within functional limits for tasks assessed    EDEMA: Increased edema on the Rt knee  POSTURE: rounded shoulders and forward head  PALPATION: TTP at Rt mid and distal quadriceps and medial aspect of Rt knee  LOWER EXTREMITY MMT:  MMT Right eval Left eval  Hip flexion 4 pain in hip 5  Hip extension    Hip abduction    Hip adduction    Hip internal rotation    Hip external rotation    Knee flexion 4+ 4+  Knee extension 4+ 5  Ankle dorsiflexion 5 5  Ankle plantarflexion 5 5  Ankle inversion    Ankle eversion     (Blank rows = not tested)  LOWER EXTREMITY ROM:  ROM Right eval Left eval  Hip flexion    Hip extension    Hip abduction    Hip adduction    Hip internal rotation    Hip external rotation    Knee flexion 121 135  Knee extension 10 6  Ankle dorsiflexion    Ankle plantarflexion    Ankle inversion    Ankle eversion     (Blank rows = not tested)  FUNCTIONAL TESTS:  5 times sit to stand: 13.24 sec - increased Rt knee pain SLS: unable to perform bil without UE support  Tandem balance: Rt forward 6 sec; Lt forward 2 sec  GAIT: Distance walked: 90 feet Assistive device utilized:  None Level of assistance: Complete Independence Comments: Decreased stance time on Rt leg with compensation by leaning to the Lt  TREATMENT DATE:  07/02/24 Bike L 5 x 7 min PT present to discuss status HS stretch at stairs 2x30 sec bil Quad stretch at stairs using chair 2x30 sec both  Standing calf stretch 2x30 bil  Standing March x 20 B at bar on foam Staggered stance on foam with head turns SBA Stepping into pants activity bent over at stair and R march; moved to 8 inch step and then to seated marching x 10 R, seated march x 5 with 5 sec hold feels restricted in R hip. Hip flexor stretch at stairs 2x30 sec (also did off bed but felt in back) Mobilization with movement to R knee with L lunge on stairs x 8 painful so stopped   06/13/24 Bike L 5 x 8 min PT present to discuss status HS stretch at stairs 2x30 sec bil Quad stretch at stairs using chair 2x30 sec both  Standing calf stretch x20 bil  Standing heel raise x 20 Standing March x 20 B at bar on foam Squat on purple foam x 20 Lunge to BOSU with one hand on bar x 10 each LE fwd Lunge to BOSU lateral with one hand on bar x 10 bil Leg press seat 7 30# easy, 50# easy and then 70# 2x10 and single leg Rt only 30# x 10 Squat to table with 10# kettle bell  2x 10 - not able to tap table but could tap table with foam  LAQ 4# 2x15 TKE blue band in standing  2x 10  06/06/24 Bike L 5 x 7 min PT present to discuss status HS stretch at stairs 2x30 sec right Quad stretch at stairs using chair 2x30 sec both (had planned on doing just right but patient wanted to do both) Standing heel raise x 20 Standing March x 20 B on foam without hand hold for majority of steps (reached for rail 3 times to steady and then let go) Mini squats x 20 on foam Wall slides x 15 pain free range Squat hold x 5 holding 10 sec Lunge to  BOSU with balance pole opposite of lead leg x 10 each LE fwd Lunge to BOSU lateral without UE support x 10 (gave just lateral lunge but patient wanted to do 3 way star type drill with BOSU on just the lateral step Sit to stand x 10 Squat to table x 10  Supine quad set x 10   PATIENT EDUCATION:  Education details: POC, HEP Person educated: Patient Education method: Chief Technology Officer Education comprehension: verbalized understanding  HOME EXERCISE PROGRAM: Access Code: [email or web address] URL: [email or web address] Date: 06/03/2024 Prepared by: JulieAccess Code: [email or web address] URL: [email or web address] Date: 07/02/2024 Prepared by: Mliss  Exercises - Seated Hamstring Stretch with Strap  - 2 x daily - 7 x weekly - 1 sets - 3 reps - 60 seconds hold - Wall Quarter Squat  - 1 x daily - 3 x weekly - 2 sets - 10 reps - Standing Romberg to 1/2 Tandem Stance  - 2 x daily - 7 x weekly - 1 sets - 5 reps - max hold - Hip Flexor Stretch on Step  - 1 x daily - 7 x weekly - 1 sets - 3 reps - 30 sec hold - Seated Hip Flexion March with Ankle Weights  - 1 x daily - 3 x weekly - 2 sets - 10 reps  Exercises - Seated Hamstring Stretch with Strap  - 2 x daily - 7 x weekly - 1 sets - 3 reps - 60  seconds hold - Wall Quarter Squat  - 1 x daily - 3 x weekly - 2 sets - 10 reps - Standing Romberg to 1/2 Tandem Stance  - 2 x daily - 7 x weekly - 1 sets - 5 reps - max hold  ASSESSMENT:  CLINICAL IMPRESSION: Patient still having pain with TKE in the medial knee. Mobilization with movement did not help, but he normally does not have pain with TKE with band. When working on hip flexion activities, he demonstrated R hip restriction and cramping. Hip flexor stretch issued to address this and seated hip flexion for strength.  Patient seeing MD on Wednesday and will inform him of his knee pain with TKE. He is compliant with his HEP.  Eval: Patient is an 84 y.o. male who was seen today for  physical therapy evaluation and treatment for decreased knee strength/ROM secondary to arthroscopic debridement on 04/16/24. Pertinent PMH includes diabetic neuropathy which negatively impacts his balance. Patient reports he is currently unable to squat and ride his stationary bike 6 miles per day since surgery. Patient has increased edema observed in the right knee and reports tightness due to the edema with knee flexion AROM. Patient's incision sites are well healed with no increased redness or warmth noted. All red flags are ruled out. Patient has functional weakness and ROM deficits where he is unable to squat down and has difficulty getting into/out of his car. The patient has impaired static balance, he is unable to perform SLS bilaterally and can only hold a tandem stance for 2 seconds with the left leg forward and 6 seconds with the right leg forward. He has decreased stance time on the Rt leg with a lateral lean to the left side observed during gait. Patient would benefit from continued skilled physical therapy to address the following: impaired balance, decreased functional mobility/strength, edema, and pain.   OBJECTIVE IMPAIRMENTS: Abnormal gait, decreased activity tolerance, decreased balance, decreased mobility, difficulty walking, decreased ROM, decreased strength, increased edema, and pain.   ACTIVITY LIMITATIONS: carrying, lifting, bending, sitting, squatting, and stairs  PARTICIPATION LIMITATIONS: cleaning, laundry, community activity, and yard work  PERSONAL FACTORS: Age and 1 comorbidity: Diabetic neuropathy are also affecting patient's functional outcome.   REHAB POTENTIAL: Excellent  CLINICAL DECISION MAKING: Stable/uncomplicated  EVALUATION COMPLEXITY: Low   GOALS: Goals reviewed with patient? Yes  SHORT TERM GOALS: Target date: 06/10/24 Patient will be compliant with HEP Baseline: Goal status: MET 06/11/24  2.  Patient will report no pain when riding his stationary  bike daily Baseline:  Goal status: MET 06/11/24  LONG TERM GOALS: Target date: 07/08/24  Patient will be able to bike 6 miles on his stationary bike pain free with the seat at it's regular height  Baseline:  Goal status: In progress  2.  Patient will improve bilateral SLS to at least 5 seconds without UE support  Baseline:  Goal status: In Progress  3.  Patient will improve his LEFS score by at least 9 points to demonstrate functional improvement  Baseline: 56/80 Goal status: INITIAL  4.  Patient will improve right knee flexion AROM to be at least 135 to be equal bilaterally  Baseline: 121 Goal status: INITIAL  PLAN:  PT FREQUENCY: 2x/week  PT DURATION: 6 weeks  PLANNED INTERVENTIONS: [id number]- PT Re-evaluation, 97110-Therapeutic exercises, 97530- Therapeutic activity, [id number]- Neuromuscular re-education, 97535- Self Care, [id number]- Manual therapy, [id number]- Gait training, [phone]- Electrical stimulation (unattended), [id number]- Electrical stimulation (manual), 20560 (1-2 muscles), [id number] (3+ muscles)- Dry Needling, Patient/Family  education, Balance training, Taping, Manual lymph drainage, Scar mobilization, and Cryotherapy  PLAN FOR NEXT SESSION: Dynamic balance exercises, mini squats, quad stretch, functional LE strengthening  Mliss Cummins, PT  07/02/2024, 11:50 AM   "

## 2024-07-05 ENCOUNTER — Encounter: Payer: Self-pay | Admitting: Physical Therapy

## 2024-07-05 ENCOUNTER — Ambulatory Visit: Admitting: Physical Therapy

## 2024-07-05 DIAGNOSIS — R293 Abnormal posture: Secondary | ICD-10-CM

## 2024-07-05 DIAGNOSIS — R262 Difficulty in walking, not elsewhere classified: Secondary | ICD-10-CM

## 2024-07-05 DIAGNOSIS — R2689 Other abnormalities of gait and mobility: Secondary | ICD-10-CM

## 2024-07-05 DIAGNOSIS — M25661 Stiffness of right knee, not elsewhere classified: Secondary | ICD-10-CM

## 2024-07-05 DIAGNOSIS — M6281 Muscle weakness (generalized): Secondary | ICD-10-CM | POA: Diagnosis not present

## 2024-07-05 DIAGNOSIS — M25561 Pain in right knee: Secondary | ICD-10-CM

## 2024-07-05 DIAGNOSIS — R252 Cramp and spasm: Secondary | ICD-10-CM

## 2024-07-05 NOTE — Therapy (Signed)
 " OUTPATIENT PHYSICAL THERAPY LOWER EXTREMITY TREATMENT AND RE-CERTIFICATION   Patient Name: Fernando Taylor MRN: [id number] DOB:Dec 09, 1940, 84 y.o., male Today's Date: 07/05/2024  END OF SESSION:  PT End of Session - 07/05/24 1018     Visit Number 8    Number of Visits --    Date for Recertification  09/04/23    Authorization Type Medicare    Progress Note Due on Visit 18    PT Start Time 1019    PT Stop Time 1108    PT Time Calculation (min) 49 min    Activity Tolerance Patient tolerated treatment well    Behavior During Therapy WFL for tasks assessed/performed             Past Medical History:  Diagnosis Date   Allergies    Diabetes mellitus (HCC)    History of prostate cancer 2010   NO RADIATION   History of tremor    NOT HEN RESTING, MOSTLY INTENTION TREMOR AND AT A CERTAIN ANGLE, ABLE TO WRITE. HAS NOT HAD MORE EVALUATION, WAS BEING OBSERVED.   HLD (hyperlipidemia)    Hx of atrial fibrillation without current medication 2014   S/P ABLATION IN [place],    Hypertension    Paroxysmal atrial fibrillation (HCC)    Thrombocytopenia    Tremor    Past Surgical History:  Procedure Laterality Date   ATRIAL FIBRILLATION ABLATION  2014   S/P   COLONOSCOPY     DUE NOW, APRIL 2020   KIDNEY STONE SURGERY     PROSTATECTOMY  2010   Patient Active Problem List   Diagnosis Date Noted   Peripheral arterial disease 09/09/2022   Persistent atrial fibrillation (HCC) 07/25/2019   Carotid artery disease 07/25/2019   Hyperlipidemia 07/25/2019   Pain due to onychomycosis of toenails of both feet 07/19/2019   Diabetic neuropathy (HCC) 07/19/2019    PCP: Regino Slater, MD  REFERRING PROVIDER: Melodi Lerner, MD  REFERRING DIAG:  Z51.89 (ICD-10-CM) - Encounter for other specified aftercare    THERAPY DIAG:  Muscle weakness (generalized) - Plan: PT plan of care cert/re-cert  Cramp and spasm - Plan: PT plan of care cert/re-cert  Difficulty in walking, not elsewhere  classified - Plan: PT plan of care cert/re-cert  Acute pain of right knee - Plan: PT plan of care cert/re-cert  Stiffness of right knee, not elsewhere classified - Plan: PT plan of care cert/re-cert  Abnormal posture - Plan: PT plan of care cert/re-cert  Other abnormalities of gait and mobility - Plan: PT plan of care cert/re-cert  Rationale for Evaluation and Treatment: Rehabilitation  ONSET DATE: 04/16/24  SUBJECTIVE:   SUBJECTIVE STATEMENT: I saw the MD. He wants my knee straighter and wants me to continue PT.   EVAL: Patient underwent surgery on 04/16/24. Patient repots he initially injured his knee after falling into his his attic when he missed a step. He had arthroscopic surgery in the Lt knee 10 years ago and he recovered well from that. Patient reports he is still unable to squat and feels like his knee is still swollen. He uses ice 2-3 times a day. Patient has a stationary bike and he was doing 6 miles a day but has not been able to do this since surgery; he is currently only able to do 3 miles. Patient states that his diabetic neuropathy is causing his balance to be worse.   PERTINENT HISTORY: Underwent arthroscopy with meniscal debridement following complex medial meniscal tear on Rt knee. Diabetic neuropathy  PAIN:  Are you having pain? Yes: NPRS scale: 0/10 Pain location: Inner portion of Rt knee Pain description: Dull Aggravating factors: Squatting Relieving factors: Ice helps numb it, Advil PRN  PRECAUTIONS: None  RED FLAGS: None   WEIGHT BEARING RESTRICTIONS: No  FALLS:  Has patient fallen in last 6 months? Yes. Number of falls Tripped going into his attic which caused the original injury  LIVING ENVIRONMENT: Lives with: lives with their spouse Lives in: House/apartment Stairs: Yes but does not use them  OCCUPATION: Retired  PLOF: Independent  PATIENT GOALS: Wants to be able to walk normally & get back to biking  NEXT MD VISIT: 07/05/23  OBJECTIVE:   Note: Objective measures were completed at Evaluation unless otherwise noted.  PATIENT SURVEYS:  LEFS  Extreme difficulty/unable (0), Quite a bit of difficulty (1), Moderate difficulty (2), Little difficulty (3), No difficulty (4) Survey date:  05/27/24 19/26  Any of your usual work, housework or school activities 4 4  2. Usual hobbies, recreational or sporting activities 4 4  3. Getting into/out of the bath 4 4  4. Walking between rooms 4 4  5. Putting on socks/shoes 3 3  6. Squatting  1 3  7. Lifting an object, like a bag of groceries from the floor 3 4  8. Performing light activities around your home 4 4  9. Performing heavy activities around your home 3 3  10. Getting into/out of a car 2 3  11. Walking 2 blocks 3 3  12. Walking 1 mile 3 2  13. Going up/down 10 stairs (1 flight) 3 4  14. Standing for 1 hour 3 3  15.  sitting for 1 hour 4 4  16. Running on even ground 1 1  17. Running on uneven ground 1 1  18. Making sharp turns while running fast 1 0  19. Hopping  2 4  20. Rolling over in bed 3 4  Score total:  56/80 62/80     COGNITION: Overall cognitive status: Within functional limits for tasks assessed    EDEMA: Increased edema on the Rt knee  POSTURE: rounded shoulders and forward head  PALPATION: TTP at Rt mid and distal quadriceps and medial aspect of Rt knee  LOWER EXTREMITY MMT:  MMT Right eval Left eval Right  07/05/24  Hip flexion 4 pain in hip 5 4  Hip extension     Hip abduction     Hip adduction     Hip internal rotation     Hip external rotation     Knee flexion 4+ 4+ 4+  Knee extension 4+ 5 4+  Ankle dorsiflexion 5 5   Ankle plantarflexion 5 5   Ankle inversion     Ankle eversion      (Blank rows = not tested)  LOWER EXTREMITY ROM:  ROM Right eval Left eval Right  19/26  Hip flexion     Hip extension     Hip abduction     Hip adduction     Hip internal rotation     Hip external rotation     Knee flexion 121 135 126 (LEFT 128)   Knee extension 10 6 / 2 5/ 0  Ankle dorsiflexion     Ankle plantarflexion     Ankle inversion     Ankle eversion      (Blank rows = not tested)  FUNCTIONAL TESTS:  5 times sit to stand: 13.24 sec - increased Rt knee pain SLS: unable to  perform bil without UE support  Tandem balance: Rt forward 6 sec; Lt forward 2 sec  07/05/24 SLS: unable to perform on the L, 2 sec on R DGI TOTAL SCORE: __21___/ 24 - deficits, horizontal head turns, stepping over obstacle and stairs (uses rail)   GAIT: Distance walked: 90 feet Assistive device utilized: None Level of assistance: Complete Independence Comments: Decreased stance time on Rt leg with compensation by leaning to the Lt                                                                                                                                TREATMENT DATE:  07/05/24 Elliptical L2 x 4 min with rest breaks as needed  PT present to discuss status  LEFS MMT/ROM Floor to stand transfer - with one UE assist; one episode of LOB which patient compensated for by running forward. Second attempt able to demonstrate with one UE support and no LOB. Also able to do with no UE assist with some unsteadiness upon standing (done on foam mat) so pt used wide BOS DGI completed 21/24 Stepping into pants activity with yellow tband: requires one UE assist; discussed safer method of sitting down to don/doff pants to decrease fall risk. Discussed POC and focus going forward of knee ROM, strength and balance  07/02/24 Bike L 5x 5 min PT present to discuss status HS stretch at stairs 2x30 sec bil Quad stretch at stairs using chair 2x30 sec both  Standing calf stretch 2x30 bil  Standing March x 20 B at bar on foam Staggered stance on foam with head turns SBA Stepping into pants activity bent over at stair and R march; moved to 8 inch step and then to seated marching x 10 R, seated march x 5 with 5 sec hold feels restricted in R hip. Hip flexor stretch at  stairs 2x30 sec (also did off bed but felt in back) Mobilization with movement to R knee with L lunge on stairs x 8 painful so stopped   06/13/24 Bike L 5 x 8 min PT present to discuss status HS stretch at stairs 2x30 sec bil Quad stretch at stairs using chair 2x30 sec both  Standing calf stretch x20 bil  Standing heel raise x 20 Standing March x 20 B at bar on foam Squat on purple foam x 20 Lunge to BOSU with one hand on bar x 10 each LE fwd Lunge to BOSU lateral with one hand on bar x 10 bil Leg press seat 7 30# easy, 50# easy and then 70# 2x10 and single leg Rt only 30# x 10 Squat to table with 10# kettle bell  2x 10 - not able to tap table but could tap table with foam  LAQ 4# 2x15 TKE blue band in standing  2x 10  06/06/24 Bike L 5 x 7 min PT present to discuss status HS stretch at stairs 2x30 sec right Quad stretch at stairs  using chair 2x30 sec both (had planned on doing just right but patient wanted to do both) Standing heel raise x 20 Standing March x 20 B on foam without hand hold for majority of steps (reached for rail 3 times to steady and then let go) Mini squats x 20 on foam Wall slides x 15 pain free range Squat hold x 5 holding 10 sec Lunge to BOSU with balance pole opposite of lead leg x 10 each LE fwd Lunge to BOSU lateral without UE support x 10 (gave just lateral lunge but patient wanted to do 3 way star type drill with BOSU on just the lateral step Sit to stand x 10 Squat to table x 10  Supine quad set x 10   PATIENT EDUCATION:  Education details: POC, HEP Person educated: Patient Education method: Chief Technology Officer Education comprehension: verbalized understanding  HOME EXERCISE PROGRAM: Access Code: [email or web address] URL: [email or web address] Date: 06/03/2024 Prepared by: JulieAccess Code: [email or web address] URL: [email or web address] Date: 07/02/2024 Prepared by: Fernando  Exercises - Seated Hamstring Stretch with Strap  - 2 x  daily - 7 x weekly - 1 sets - 3 reps - 60 seconds hold - Wall Quarter Squat  - 1 x daily - 3 x weekly - 2 sets - 10 reps - Standing Romberg to 1/2 Tandem Stance  - 2 x daily - 7 x weekly - 1 sets - 5 reps - max hold - Hip Flexor Stretch on Step  - 1 x daily - 7 x weekly - 1 sets - 3 reps - 30 sec hold - Seated Hip Flexion March with Ankle Weights  - 1 x daily - 3 x weekly - 2 sets - 10 reps  Exercises - Seated Hamstring Stretch with Strap  - 2 x daily - 7 x weekly - 1 sets - 3 reps - 60 seconds hold - Wall Quarter Squat  - 1 x daily - 3 x weekly - 2 sets - 10 reps - Standing Romberg to 1/2 Tandem Stance  - 2 x daily - 7 x weekly - 1 sets - 5 reps - max hold  ASSESSMENT:  CLINICAL IMPRESSION: Fernando Taylor  saw MD and he would like pt to continue PT. Fernando Taylor is still having pain with TKE in the medial knee. His ROM has improved to 5 to 126 degrees and passively extension is 0. Patient has significant HS and quad tightness B. We addressed HS today with prone knee hang with weight. He still demonstrates weakness in R knee flex and extension although he was able to perform a floor to stand transfer today with support and no support. This was challenging and he will benefit from ongoing functional strengthening. DGI was completed and his score of 21/24 is acceptable, however he has some difficulty with horizontal head turns and stepping over obstacles due to SLS. He is still unable to balance at all on the L LE, but can stand 2 sec now on the R. His LEFS has improved by 6 points. Fernando Taylor continues to demonstrate potential for improvement and would benefit from continued skilled therapy to address remaining impairments.  I recommend 2x/wk for 8 wks.   OBJECTIVE IMPAIRMENTS: Abnormal gait, decreased activity tolerance, decreased balance, decreased mobility, difficulty walking, decreased ROM, decreased strength, increased edema, and pain.   ACTIVITY LIMITATIONS: carrying, lifting, bending, sitting,  squatting, and stairs  PARTICIPATION LIMITATIONS: cleaning, laundry, community activity, and yard work  PERSONAL FACTORS: Age and 1 comorbidity: Diabetic neuropathy are also affecting patient's functional  outcome.   REHAB POTENTIAL: Excellent  CLINICAL DECISION MAKING: Stable/uncomplicated  EVALUATION COMPLEXITY: Low   GOALS: Goals reviewed with patient? Yes  SHORT TERM GOALS: Target date: 06/10/24 Patient will be compliant with HEP Baseline: Goal status: MET 06/11/24  2.  Patient will report no pain when riding his stationary bike daily Baseline:  Goal status: MET 06/11/24  LONG TERM GOALS: Target date: 09/03/24  Patient will be able to bike 6 miles on his stationary bike pain free with the seat at it's regular height  Baseline:  Goal status:  MET  2.  Patient will improve bilateral SLS to at least 5 seconds without UE support  Baseline:  Goal status:IN PROGRESS 07/05/24   3.  Patient will improve his LEFS score by at least 9 points to demonstrate functional improvement  Baseline: 56/80 Goal status: IN PROGRESS 07/05/24  4.  Patient will improve right knee flexion AROM to be at least 135 to be equal bilaterally  Baseline: 121 Goal status: IN PROGRESS 07/05/24  5.  Patient able to step over obstacles when walking without difficulty and negotiate curbs without UE assist demonstrating improved LE strength and balance. Baseline:  Goal status: NEW     PLAN:  PT FREQUENCY: 2x/week  PT DURATION: 8 weeks  PLANNED INTERVENTIONS: [id number]- PT Re-evaluation, 97110-Therapeutic exercises, 97530- Therapeutic activity, [id number]- Neuromuscular re-education, 97535- Self Care, [id number]- Manual therapy, [phone]- Gait training, [phone]- Electrical stimulation (unattended), [phone]- Electrical stimulation (manual), 20560 (1-2 muscles), [id number] (3+ muscles)- Dry Needling, Patient/Family education, Balance training, Taping, Manual lymph drainage, Scar mobilization, and Cryotherapy  PLAN FOR NEXT SESSION:  Focus on ROM, knee strength and dynamic balance exercises, including stepping over obstacles and curbs.   Fernando Taylor, PT  07/05/2024, 11:39 AM   "

## 2024-07-10 ENCOUNTER — Ambulatory Visit: Admitting: Physical Therapy

## 2024-07-10 ENCOUNTER — Encounter: Payer: Self-pay | Admitting: Physical Therapy

## 2024-07-10 DIAGNOSIS — M25661 Stiffness of right knee, not elsewhere classified: Secondary | ICD-10-CM

## 2024-07-10 DIAGNOSIS — M25561 Pain in right knee: Secondary | ICD-10-CM

## 2024-07-10 DIAGNOSIS — M6281 Muscle weakness (generalized): Secondary | ICD-10-CM | POA: Diagnosis not present

## 2024-07-10 DIAGNOSIS — R262 Difficulty in walking, not elsewhere classified: Secondary | ICD-10-CM

## 2024-07-10 DIAGNOSIS — R252 Cramp and spasm: Secondary | ICD-10-CM

## 2024-07-10 NOTE — Therapy (Signed)
 " OUTPATIENT PHYSICAL THERAPY LOWER EXTREMITY TREATMENT   Patient Name: Fernando Taylor MRN: [id number] DOB:September 04, 1940, 84 y.o., male Today's Date: 07/10/2024  END OF SESSION:  PT End of Session - 07/10/24 0927     Visit Number 9    Number of Visits 13    Date for Recertification  09/04/23    Authorization Type Medicare    PT Start Time 0928    PT Stop Time 1013    PT Time Calculation (min) 45 min    Activity Tolerance Patient tolerated treatment well    Behavior During Therapy WFL for tasks assessed/performed              Past Medical History:  Diagnosis Date   Allergies    Diabetes mellitus (HCC)    History of prostate cancer 2010   NO RADIATION   History of tremor    NOT HEN RESTING, MOSTLY INTENTION TREMOR AND AT A CERTAIN ANGLE, ABLE TO WRITE. HAS NOT HAD MORE EVALUATION, WAS BEING OBSERVED.   HLD (hyperlipidemia)    Hx of atrial fibrillation without current medication 2014   S/P ABLATION IN [place],    Hypertension    Paroxysmal atrial fibrillation (HCC)    Thrombocytopenia    Tremor    Past Surgical History:  Procedure Laterality Date   ATRIAL FIBRILLATION ABLATION  2014   S/P   COLONOSCOPY     DUE NOW, APRIL 2020   KIDNEY STONE SURGERY     PROSTATECTOMY  2010   Patient Active Problem List   Diagnosis Date Noted   Peripheral arterial disease 09/09/2022   Persistent atrial fibrillation (HCC) 07/25/2019   Carotid artery disease 07/25/2019   Hyperlipidemia 07/25/2019   Pain due to onychomycosis of toenails of both feet 07/19/2019   Diabetic neuropathy (HCC) 07/19/2019    PCP: Regino Slater, MD  REFERRING PROVIDER: Melodi Lerner, MD  REFERRING DIAG:  Z51.89 (ICD-10-CM) - Encounter for other specified aftercare    THERAPY DIAG:  Muscle weakness (generalized)  Cramp and spasm  Difficulty in walking, not elsewhere classified  Acute pain of right knee  Stiffness of right knee, not elsewhere classified  Rationale for Evaluation and Treatment:  Rehabilitation  ONSET DATE: 04/16/24  SUBJECTIVE:   SUBJECTIVE STATEMENT: The elliptical caused me a lot of pain in the inside of both knees the next day.  I'm not doing that again.  Pt has ankle weights and will work on prone knee hang at home.   EVAL: Patient underwent surgery on 04/16/24. Patient repots he initially injured his knee after falling into his his attic when he missed a step. He had arthroscopic surgery in the Lt knee 10 years ago and he recovered well from that. Patient reports he is still unable to squat and feels like his knee is still swollen. He uses ice 2-3 times a day. Patient has a stationary bike and he was doing 6 miles a day but has not been able to do this since surgery; he is currently only able to do 3 miles. Patient states that his diabetic neuropathy is causing his balance to be worse.   PERTINENT HISTORY: Underwent arthroscopy with meniscal debridement following complex medial meniscal tear on Rt knee. Diabetic neuropathy  PAIN:  Are you having pain? Yes: NPRS scale: 1/10 Pain location: Inner portion of Rt knee Pain description: Dull Aggravating factors: Squatting Relieving factors: Ice helps numb it, Advil PRN  PRECAUTIONS: None  RED FLAGS: None   WEIGHT BEARING RESTRICTIONS: No  FALLS:  Has patient fallen in last 6 months? Yes. Number of falls Tripped going into his attic which caused the original injury  LIVING ENVIRONMENT: Lives with: lives with their spouse Lives in: House/apartment Stairs: Yes but does not use them  OCCUPATION: Retired  PLOF: Independent  PATIENT GOALS: Wants to be able to walk normally & get back to biking  NEXT MD VISIT: 07/05/23  OBJECTIVE:  Note: Objective measures were completed at Evaluation unless otherwise noted.  PATIENT SURVEYS:  LEFS  Extreme difficulty/unable (0), Quite a bit of difficulty (1), Moderate difficulty (2), Little difficulty (3), No difficulty (4) Survey date:  05/27/24 19/26  Any of your  usual work, housework or school activities 4 4  2. Usual hobbies, recreational or sporting activities 4 4  3. Getting into/out of the bath 4 4  4. Walking between rooms 4 4  5. Putting on socks/shoes 3 3  6. Squatting  1 3  7. Lifting an object, like a bag of groceries from the floor 3 4  8. Performing light activities around your home 4 4  9. Performing heavy activities around your home 3 3  10. Getting into/out of a car 2 3  11. Walking 2 blocks 3 3  12. Walking 1 mile 3 2  13. Going up/down 10 stairs (1 flight) 3 4  14. Standing for 1 hour 3 3  15.  sitting for 1 hour 4 4  16. Running on even ground 1 1  17. Running on uneven ground 1 1  18. Making sharp turns while running fast 1 0  19. Hopping  2 4  20. Rolling over in bed 3 4  Score total:  56/80 62/80     COGNITION: Overall cognitive status: Within functional limits for tasks assessed    EDEMA: Increased edema on the Rt knee  POSTURE: rounded shoulders and forward head  PALPATION: TTP at Rt mid and distal quadriceps and medial aspect of Rt knee  LOWER EXTREMITY MMT:  MMT Right eval Left eval Right  07/05/24  Hip flexion 4 pain in hip 5 4  Hip extension     Hip abduction     Hip adduction     Hip internal rotation     Hip external rotation     Knee flexion 4+ 4+ 4+  Knee extension 4+ 5 4+  Ankle dorsiflexion 5 5   Ankle plantarflexion 5 5   Ankle inversion     Ankle eversion      (Blank rows = not tested)  LOWER EXTREMITY ROM:  ROM Right eval Left eval Right  19/26  Hip flexion     Hip extension     Hip abduction     Hip adduction     Hip internal rotation     Hip external rotation     Knee flexion 121 135 126 (LEFT 128)  Knee extension 10 6 / 2 5/ 0  Ankle dorsiflexion     Ankle plantarflexion     Ankle inversion     Ankle eversion      (Blank rows = not tested)  FUNCTIONAL TESTS:  5 times sit to stand: 13.24 sec - increased Rt knee pain SLS: unable to perform bil without UE support   Tandem balance: Rt forward 6 sec; Lt forward 2 sec  07/05/24 SLS: unable to perform on the L, 2 sec on R DGI TOTAL SCORE: __21___/ 24 - deficits, horizontal head turns, stepping over obstacle and stairs (uses rail)  GAIT: Distance walked: 90 feet Assistive device utilized: None Level of assistance: Complete Independence Comments: Decreased stance time on Rt leg with compensation by leaning to the Lt                                                                                                                                TREATMENT DATE:  07/10/24 Recumbent bike L3 x 5' PT present to discuss status and plan for session Gastroc stretch slant board alt with HS stretch foot on 3rd step x3 rounds 30 each Hip flexor stetch foot on 3rd step 3x10 each March taps to 3rd step alt LE standing on foam Wall squats 2x10, with final rep hold as static squat x10 Standing on foam march taps to 3rd step with light single UE support 1x10 each side, then alt x 20 Floor transfer practice with single UE support on mat table x3 rounds Leg press seat 7 90lb bil LE (Pt chose to increase weight today) x20, then in stagger stance with cue for 70% Rt LE 30% Lt LE effort x12 Standing T at counter with single UE support x12 each side  07/05/24 Elliptical L2 x 4 min with rest breaks as needed  PT present to discuss status  LEFS MMT/ROM Floor to stand transfer - with one UE assist; one episode of LOB which patient compensated for by running forward. Second attempt able to demonstrate with one UE support and no LOB. Also able to do with no UE assist with some unsteadiness upon standing (done on foam mat) so pt used wide BOS DGI completed 21/24 Stepping into pants activity with yellow tband: requires one UE assist; discussed safer method of sitting down to don/doff pants to decrease fall risk. Discussed POC and focus going forward of knee ROM, strength and balance  07/02/24 Bike L 5x 5 min PT present to  discuss status HS stretch at stairs 2x30 sec bil Quad stretch at stairs using chair 2x30 sec both  Standing calf stretch 2x30 bil  Standing March x 20 B at bar on foam Staggered stance on foam with head turns SBA Stepping into pants activity bent over at stair and R march; moved to 8 inch step and then to seated marching x 10 R, seated march x 5 with 5 sec hold feels restricted in R hip. Hip flexor stretch at stairs 2x30 sec (also did off bed but felt in back) Mobilization with movement to R knee with L lunge on stairs x 8 painful so stopped   06/13/24 Bike L 5 x 8 min PT present to discuss status HS stretch at stairs 2x30 sec bil Quad stretch at stairs using chair 2x30 sec both  Standing calf stretch x20 bil  Standing heel raise x 20 Standing March x 20 B at bar on foam Squat on purple foam x 20 Lunge to BOSU with one hand on bar x 10 each LE fwd Lunge to BOSU lateral  with one hand on bar x 10 bil Leg press seat 7 30# easy, 50# easy and then 70# 2x10 and single leg Rt only 30# x 10 Squat to table with 10# kettle bell  2x 10 - not able to tap table but could tap table with foam  LAQ 4# 2x15 TKE blue band in standing  2x 10  06/06/24 Bike L 5 x 7 min PT present to discuss status HS stretch at stairs 2x30 sec right Quad stretch at stairs using chair 2x30 sec both (had planned on doing just right but patient wanted to do both) Standing heel raise x 20 Standing March x 20 B on foam without hand hold for majority of steps (reached for rail 3 times to steady and then let go) Mini squats x 20 on foam Wall slides x 15 pain free range Squat hold x 5 holding 10 sec Lunge to BOSU with balance pole opposite of lead leg x 10 each LE fwd Lunge to BOSU lateral without UE support x 10 (gave just lateral lunge but patient wanted to do 3 way star type drill with BOSU on just the lateral step Sit to stand x 10 Squat to table x 10  Supine quad set x 10   PATIENT EDUCATION:  Education details:  POC, HEP Person educated: Patient Education method: Chief Technology Officer Education comprehension: verbalized understanding  HOME EXERCISE PROGRAM: Access Code: [email or web address] URL: [email or web address] Date: 06/03/2024 Prepared by: JulieAccess Code: [email or web address] URL: [email or web address] Date: 07/02/2024 Prepared by: Mliss  Exercises - Seated Hamstring Stretch with Strap  - 2 x daily - 7 x weekly - 1 sets - 3 reps - 60 seconds hold - Wall Quarter Squat  - 1 x daily - 3 x weekly - 2 sets - 10 reps - Standing Romberg to 1/2 Tandem Stance  - 2 x daily - 7 x weekly - 1 sets - 5 reps - max hold - Hip Flexor Stretch on Step  - 1 x daily - 7 x weekly - 1 sets - 3 reps - 30 sec hold - Seated Hip Flexion March with Ankle Weights  - 1 x daily - 3 x weekly - 2 sets - 10 reps  Exercises - Seated Hamstring Stretch with Strap  - 2 x daily - 7 x weekly - 1 sets - 3 reps - 60 seconds hold - Wall Quarter Squat  - 1 x daily - 3 x weekly - 2 sets - 10 reps - Standing Romberg to 1/2 Tandem Stance  - 2 x daily - 7 x weekly - 1 sets - 5 reps - max hold  ASSESSMENT:  CLINICAL IMPRESSION: Bear did very well with focused stretching of gastroc and HS on Rt side to improve knee ext today.  He needs cues to fully engage quad at top of squat to stand and with SLS tasks to fully use knee ext.  We added demands of quad strength on leg press in staggered foot position and with eccentric HS today with standing T at counter. He was able to demo floor transfer x3 reps today with some cues for technique, sequencing and safety.   OBJECTIVE IMPAIRMENTS: Abnormal gait, decreased activity tolerance, decreased balance, decreased mobility, difficulty walking, decreased ROM, decreased strength, increased edema, and pain.   ACTIVITY LIMITATIONS: carrying, lifting, bending, sitting, squatting, and stairs  PARTICIPATION LIMITATIONS: cleaning, laundry, community activity, and yard work  PERSONAL  FACTORS: Age and 1 comorbidity: Diabetic neuropathy are also affecting patient's functional outcome.  REHAB POTENTIAL: Excellent  CLINICAL DECISION MAKING: Stable/uncomplicated  EVALUATION COMPLEXITY: Low   GOALS: Goals reviewed with patient? Yes  SHORT TERM GOALS: Target date: 06/10/24 Patient will be compliant with HEP Baseline: Goal status: MET 06/11/24  2.  Patient will report no pain when riding his stationary bike daily Baseline:  Goal status: MET 06/11/24  LONG TERM GOALS: Target date: 09/03/24  Patient will be able to bike 6 miles on his stationary bike pain free with the seat at it's regular height  Baseline:  Goal status:  MET  2.  Patient will improve bilateral SLS to at least 5 seconds without UE support  Baseline:  Goal status:IN PROGRESS 07/05/24   3.  Patient will improve his LEFS score by at least 9 points to demonstrate functional improvement  Baseline: 56/80 Goal status: IN PROGRESS 07/05/24  4.  Patient will improve right knee flexion AROM to be at least 135 to be equal bilaterally  Baseline: 121 Goal status: IN PROGRESS 07/05/24  5.  Patient able to step over obstacles when walking without difficulty and negotiate curbs without UE assist demonstrating improved LE strength and balance. Baseline:  Goal status: NEW     PLAN:  PT FREQUENCY: 2x/week  PT DURATION: 8 weeks  PLANNED INTERVENTIONS: [id number]- PT Re-evaluation, 97110-Therapeutic exercises, 97530- Therapeutic activity, [id number]- Neuromuscular re-education, 97535- Self Care, [id number]- Manual therapy, [phone]- Gait training, [phone]- Electrical stimulation (unattended), [phone]- Electrical stimulation (manual), 20560 (1-2 muscles), 20561 (3+ muscles)- Dry Needling, Patient/Family education, Balance training, Taping, Manual lymph drainage, Scar mobilization, and Cryotherapy  PLAN FOR NEXT SESSION: Focus on ROM, knee strength and dynamic balance exercises, including stepping over obstacles and curbs.   Shawntell Dixson, PT 07/10/2024 10:15 AM   "

## 2024-07-17 ENCOUNTER — Ambulatory Visit: Admitting: Rehabilitation

## 2024-07-17 ENCOUNTER — Encounter: Payer: Self-pay | Admitting: Rehabilitation

## 2024-07-17 DIAGNOSIS — R262 Difficulty in walking, not elsewhere classified: Secondary | ICD-10-CM

## 2024-07-17 DIAGNOSIS — R293 Abnormal posture: Secondary | ICD-10-CM

## 2024-07-17 DIAGNOSIS — M6281 Muscle weakness (generalized): Secondary | ICD-10-CM

## 2024-07-17 DIAGNOSIS — M25561 Pain in right knee: Secondary | ICD-10-CM

## 2024-07-17 DIAGNOSIS — R252 Cramp and spasm: Secondary | ICD-10-CM

## 2024-07-17 DIAGNOSIS — M25661 Stiffness of right knee, not elsewhere classified: Secondary | ICD-10-CM

## 2024-07-17 DIAGNOSIS — R2689 Other abnormalities of gait and mobility: Secondary | ICD-10-CM

## 2024-07-17 NOTE — Therapy (Signed)
 " OUTPATIENT PHYSICAL THERAPY LOWER EXTREMITY TREATMENT   Patient Name: Fernando Taylor MRN: [id number] DOB:[birth year], 84 y.o., male Today's Date: 07/17/2024  END OF SESSION:  PT End of Session - 07/17/24 1000     Visit Number 10    Date for Recertification  09/04/23    Progress Note Due on Visit 18    PT Start Time 1002    PT Stop Time 1042    PT Time Calculation (min) 40 min    Activity Tolerance Patient tolerated treatment well    Behavior During Therapy WFL for tasks assessed/performed               Past Medical History:  Diagnosis Date   Allergies    Diabetes mellitus (HCC)    History of prostate cancer 2010   NO RADIATION   History of tremor    NOT HEN RESTING, MOSTLY INTENTION TREMOR AND AT A CERTAIN ANGLE, ABLE TO WRITE. HAS NOT HAD MORE EVALUATION, WAS BEING OBSERVED.   HLD (hyperlipidemia)    Hx of atrial fibrillation without current medication 2014   S/P ABLATION IN [place],    Hypertension    Paroxysmal atrial fibrillation (HCC)    Thrombocytopenia    Tremor    Past Surgical History:  Procedure Laterality Date   ATRIAL FIBRILLATION ABLATION  2014   S/P   COLONOSCOPY     DUE NOW, APRIL 2020   KIDNEY STONE SURGERY     PROSTATECTOMY  2010   Patient Active Problem List   Diagnosis Date Noted   Peripheral arterial disease 09/09/2022   Persistent atrial fibrillation (HCC) 07/25/2019   Carotid artery disease 07/25/2019   Hyperlipidemia 07/25/2019   Pain due to onychomycosis of toenails of both feet 07/19/2019   Diabetic neuropathy (HCC) 07/19/2019    PCP: Regino Slater, MD  REFERRING PROVIDER: Melodi Lerner, MD  REFERRING DIAG:  Z51.89 (ICD-10-CM) - Encounter for other specified aftercare    THERAPY DIAG:  Muscle weakness (generalized)  Cramp and spasm  Difficulty in walking, not elsewhere classified  Acute pain of right knee  Stiffness of right knee, not elsewhere classified  Abnormal posture  Other abnormalities of gait and  mobility  Rationale for Evaluation and Treatment: Rehabilitation  ONSET DATE: 04/16/24  SUBJECTIVE:   SUBJECTIVE STATEMENT: No pain  PERTINENT HISTORY: Underwent arthroscopy with meniscal debridement following complex medial meniscal tear on Rt knee. Diabetic neuropathy  PAIN:  No pain  PRECAUTIONS: None  RED FLAGS: None   WEIGHT BEARING RESTRICTIONS: No  FALLS:  Has patient fallen in last 6 months? Yes. Number of falls Tripped going into his attic which caused the original injury  LIVING ENVIRONMENT: Lives with: lives with their spouse Lives in: House/apartment Stairs: Yes but does not use them  OCCUPATION: Retired  PLOF: Independent  PATIENT GOALS: Wants to be able to walk normally & get back to biking  NEXT MD VISIT: 07/05/23  OBJECTIVE:  Note: Objective measures were completed at Evaluation unless otherwise noted.  PATIENT SURVEYS:  LEFS  Extreme difficulty/unable (0), Quite a bit of difficulty (1), Moderate difficulty (2), Little difficulty (3), No difficulty (4) Survey date:  05/27/24 19/26  Any of your usual work, housework or school activities 4 4  2. Usual hobbies, recreational or sporting activities 4 4  3. Getting into/out of the bath 4 4  4. Walking between rooms 4 4  5. Putting on socks/shoes 3 3  6. Squatting  1 3  7. Lifting an object, like a  bag of groceries from the floor 3 4  8. Performing light activities around your home 4 4  9. Performing heavy activities around your home 3 3  10. Getting into/out of a car 2 3  11. Walking 2 blocks 3 3  12. Walking 1 mile 3 2  13. Going up/down 10 stairs (1 flight) 3 4  14. Standing for 1 hour 3 3  15.  sitting for 1 hour 4 4  16. Running on even ground 1 1  17. Running on uneven ground 1 1  18. Making sharp turns while running fast 1 0  19. Hopping  2 4  20. Rolling over in bed 3 4  Score total:  56/80 62/80     COGNITION: Overall cognitive status: Within functional limits for tasks  assessed    EDEMA: Increased edema on the Rt knee  POSTURE: rounded shoulders and forward head  PALPATION: TTP at Rt mid and distal quadriceps and medial aspect of Rt knee  LOWER EXTREMITY MMT:  MMT Right eval Left eval Right  07/05/24  Hip flexion 4 pain in hip 5 4  Hip extension     Hip abduction     Hip adduction     Hip internal rotation     Hip external rotation     Knee flexion 4+ 4+ 4+  Knee extension 4+ 5 4+  Ankle dorsiflexion 5 5   Ankle plantarflexion 5 5   Ankle inversion     Ankle eversion      (Blank rows = not tested)  LOWER EXTREMITY ROM:  ROM Right eval Left eval Right  19/26  Hip flexion     Hip extension     Hip abduction     Hip adduction     Hip internal rotation     Hip external rotation     Knee flexion 121 135 126 (LEFT 128)  Knee extension 10 6 / 2 5/ 0  Ankle dorsiflexion     Ankle plantarflexion     Ankle inversion     Ankle eversion      (Blank rows = not tested)  FUNCTIONAL TESTS:  5 times sit to stand: 13.24 sec - increased Rt knee pain SLS: unable to perform bil without UE support  Tandem balance: Rt forward 6 sec; Lt forward 2 sec  07/05/24 SLS: unable to perform on the L, 2 sec on R DGI TOTAL SCORE: __21___/ 24 - deficits, horizontal head turns, stepping over obstacle and stairs (uses rail)   GAIT: Distance walked: 90 feet Assistive device utilized: None Level of assistance: Complete Independence Comments: Decreased stance time on Rt leg with compensation by leaning to the Lt                                                                                                                                TREATMENT DATE:  07/17/24 Recumbent bike L3 x 6' PT present  to discuss status and plan for session Gastroc stretch slant board alt with HS stretch foot on 3rd step x3 rounds 30 each Chair quad stretch Rt 2x30 Hip flexor stetch foot on 3rd step 3x10 each Wall squats 2x10, with final rep hold as static squat  x10 Standing on foam march taps to 3rd step with light single UE support 1x10 each side, then alt x 20 Leg press seat 7 90lb bil LE (Pt chose to increase weight today) x20, then used edge of board for calf raise x 15 Standing T at chair with single UE support x12 each side Hamstring curls 4# x 10 Hip ext 4# x10   07/10/24 Recumbent bike L3 x 5' PT present to discuss status and plan for session Gastroc stretch slant board alt with HS stretch foot on 3rd step x3 rounds 30 each Hip flexor stetch foot on 3rd step 3x10 each March taps to 3rd step alt LE standing on foam Wall squats 2x10, with final rep hold as static squat x10 Standing on foam march taps to 3rd step with light single UE support 1x10 each side, then alt x 20 Floor transfer practice with single UE support on mat table x3 rounds Leg press seat 7 90lb bil LE (Pt chose to increase weight today) x20, then in stagger stance with cue for 70% Rt LE 30% Lt LE effort x12 Standing T at counter with single UE support x12 each side  07/05/24 Elliptical L2 x 4 min with rest breaks as needed  PT present to discuss status  LEFS MMT/ROM Floor to stand transfer - with one UE assist; one episode of LOB which patient compensated for by running forward. Second attempt able to demonstrate with one UE support and no LOB. Also able to do with no UE assist with some unsteadiness upon standing (done on foam mat) so pt used wide BOS DGI completed 21/24 Stepping into pants activity with yellow tband: requires one UE assist; discussed safer method of sitting down to don/doff pants to decrease fall risk. Discussed POC and focus going forward of knee ROM, strength and balance  PATIENT EDUCATION:  Education details: POC, HEP Person educated: Patient Education method: Chief Technology Officer Education comprehension: verbalized understanding  HOME EXERCISE PROGRAM: Access Code: [email or web address] URL: [email or web address] Date:  06/03/2024 Prepared by: JulieAccess Code: [email or web address] URL: [email or web address] Date: 07/02/2024 Prepared by: Mliss  Exercises - Seated Hamstring Stretch with Strap  - 2 x daily - 7 x weekly - 1 sets - 3 reps - 60 seconds hold - Wall Quarter Squat  - 1 x daily - 3 x weekly - 2 sets - 10 reps - Standing Romberg to 1/2 Tandem Stance  - 2 x daily - 7 x weekly - 1 sets - 5 reps - max hold - Hip Flexor Stretch on Step  - 1 x daily - 7 x weekly - 1 sets - 3 reps - 30 sec hold - Seated Hip Flexion March with Ankle Weights  - 1 x daily - 3 x weekly - 2 sets - 10 reps  Exercises - Seated Hamstring Stretch with Strap  - 2 x daily - 7 x weekly - 1 sets - 3 reps - 60 seconds hold - Wall Quarter Squat  - 1 x daily - 3 x weekly - 2 sets - 10 reps - Standing Romberg to 1/2 Tandem Stance  - 2 x daily - 7 x weekly - 1 sets - 5 reps - max hold  ASSESSMENT:  CLINICAL  IMPRESSION: Pt is having minimal pain and is limited mainly by feelings of tightness at the joint.  Continued POC.  He mentioned upon leaving that he would want to try getting up off the floor again and liked the technique of using his hands on one thigh and then switching to hands on each thigh like Orvil taught him.   OBJECTIVE IMPAIRMENTS: Abnormal gait, decreased activity tolerance, decreased balance, decreased mobility, difficulty walking, decreased ROM, decreased strength, increased edema, and pain.   ACTIVITY LIMITATIONS: carrying, lifting, bending, sitting, squatting, and stairs  PARTICIPATION LIMITATIONS: cleaning, laundry, community activity, and yard work  PERSONAL FACTORS: Age and 1 comorbidity: Diabetic neuropathy are also affecting patient's functional outcome.   REHAB POTENTIAL: Excellent  CLINICAL DECISION MAKING: Stable/uncomplicated  EVALUATION COMPLEXITY: Low   GOALS: Goals reviewed with patient? Yes  SHORT TERM GOALS: Target date: 06/10/24 Patient will be compliant with HEP Baseline: Goal  status: MET 06/11/24  2.  Patient will report no pain when riding his stationary bike daily Baseline:  Goal status: MET 06/11/24  LONG TERM GOALS: Target date: 09/03/24  Patient will be able to bike 6 miles on his stationary bike pain free with the seat at it's regular height  Baseline:  Goal status:  MET  2.  Patient will improve bilateral SLS to at least 5 seconds without UE support  Baseline:  Goal status:IN PROGRESS 07/05/24   3.  Patient will improve his LEFS score by at least 9 points to demonstrate functional improvement  Baseline: 56/80 Goal status: IN PROGRESS 07/05/24  4.  Patient will improve right knee flexion AROM to be at least 135 to be equal bilaterally  Baseline: 121 Goal status: IN PROGRESS 07/05/24  5.  Patient able to step over obstacles when walking without difficulty and negotiate curbs without UE assist demonstrating improved LE strength and balance. Baseline:  Goal status: NEW     PLAN:  PT FREQUENCY: 2x/week  PT DURATION: 8 weeks  PLANNED INTERVENTIONS: 97164- PT Re-evaluation, 97110-Therapeutic exercises, 97530- Therapeutic activity, [id number]- Neuromuscular re-education, 97535- Self Care, 02859- Manual therapy, [phone]- Gait training, [phone]- Electrical stimulation (unattended), [phone]- Electrical stimulation (manual), 20560 (1-2 muscles), 20561 (3+ muscles)- Dry Needling, Patient/Family education, Balance training, Taping, Manual lymph drainage, Scar mobilization, and Cryotherapy  PLAN FOR NEXT SESSION: Focus on ROM, knee strength and dynamic balance exercises, including stepping over obstacles and curbs.   Saddie Raw, PT 07/17/24, 10:50 AM   07/17/24 10:48 AM   "

## 2024-07-19 ENCOUNTER — Ambulatory Visit

## 2024-07-19 DIAGNOSIS — M6281 Muscle weakness (generalized): Secondary | ICD-10-CM | POA: Diagnosis not present

## 2024-07-19 DIAGNOSIS — R2689 Other abnormalities of gait and mobility: Secondary | ICD-10-CM

## 2024-07-19 DIAGNOSIS — M25561 Pain in right knee: Secondary | ICD-10-CM

## 2024-07-19 DIAGNOSIS — M25661 Stiffness of right knee, not elsewhere classified: Secondary | ICD-10-CM

## 2024-07-19 DIAGNOSIS — R252 Cramp and spasm: Secondary | ICD-10-CM

## 2024-07-19 DIAGNOSIS — R262 Difficulty in walking, not elsewhere classified: Secondary | ICD-10-CM

## 2024-07-19 DIAGNOSIS — R293 Abnormal posture: Secondary | ICD-10-CM

## 2024-07-19 NOTE — Therapy (Signed)
 " OUTPATIENT PHYSICAL THERAPY LOWER EXTREMITY TREATMENT   Patient Name: Fernando Taylor MRN: [id number] DOB:September 24, 1940, 84 y.o., male Today's Date: 07/19/2024  END OF SESSION:  PT End of Session - 07/19/24 0900     Visit Number 11    Number of Visits 13    Date for Recertification  09/04/23    Authorization Type Medicare    Progress Note Due on Visit 18    PT Start Time 0901    PT Stop Time 0955    PT Time Calculation (min) 54 min    Activity Tolerance Patient tolerated treatment well    Behavior During Therapy WFL for tasks assessed/performed               Past Medical History:  Diagnosis Date   Allergies    Diabetes mellitus (HCC)    History of prostate cancer 2010   NO RADIATION   History of tremor    NOT HEN RESTING, MOSTLY INTENTION TREMOR AND AT A CERTAIN ANGLE, ABLE TO WRITE. HAS NOT HAD MORE EVALUATION, WAS BEING OBSERVED.   HLD (hyperlipidemia)    Hx of atrial fibrillation without current medication 2014   S/P ABLATION IN [place],    Hypertension    Paroxysmal atrial fibrillation (HCC)    Thrombocytopenia    Tremor    Past Surgical History:  Procedure Laterality Date   ATRIAL FIBRILLATION ABLATION  2014   S/P   COLONOSCOPY     DUE NOW, APRIL 2020   KIDNEY STONE SURGERY     PROSTATECTOMY  2010   Patient Active Problem List   Diagnosis Date Noted   Peripheral arterial disease 09/09/2022   Persistent atrial fibrillation (HCC) 07/25/2019   Carotid artery disease 07/25/2019   Hyperlipidemia 07/25/2019   Pain due to onychomycosis of toenails of both feet 07/19/2019   Diabetic neuropathy (HCC) 07/19/2019    PCP: Regino Slater, MD  REFERRING PROVIDER: Melodi Lerner, MD  REFERRING DIAG:  Z51.89 (ICD-10-CM) - Encounter for other specified aftercare    THERAPY DIAG:  Muscle weakness (generalized)  Cramp and spasm  Difficulty in walking, not elsewhere classified  Acute pain of right knee  Stiffness of right knee, not elsewhere  classified  Abnormal posture  Other abnormalities of gait and mobility  Rationale for Evaluation and Treatment: Rehabilitation  ONSET DATE: 04/16/24  SUBJECTIVE:   SUBJECTIVE STATEMENT: I don't have any knee pain right now but my knee has got stuck twice in extension since I was here. Both times it happened after I had been stretching it in extension with it just relaxed and I couldn't get it to bend. I could feel it lock right below the patella.   PERTINENT HISTORY: Underwent arthroscopy with meniscal debridement following complex medial meniscal tear on Rt knee. Diabetic neuropathy  PAIN:  No pain  PRECAUTIONS: None  RED FLAGS: None   WEIGHT BEARING RESTRICTIONS: No  FALLS:  Has patient fallen in last 6 months? Yes. Number of falls Tripped going into his attic which caused the original injury  LIVING ENVIRONMENT: Lives with: lives with their spouse Lives in: House/apartment Stairs: Yes but does not use them  OCCUPATION: Retired  PLOF: Independent  PATIENT GOALS: Wants to be able to walk normally & get back to biking  NEXT MD VISIT: 07/05/23  OBJECTIVE:  Note: Objective measures were completed at Evaluation unless otherwise noted.  PATIENT SURVEYS:  LEFS  Extreme difficulty/unable (0), Quite a bit of difficulty (1), Moderate difficulty (2), Little difficulty (3), No  difficulty (4) Survey date:  05/27/24 19/26  Any of your usual work, housework or school activities 4 4  2. Usual hobbies, recreational or sporting activities 4 4  3. Getting into/out of the bath 4 4  4. Walking between rooms 4 4  5. Putting on socks/shoes 3 3  6. Squatting  1 3  7. Lifting an object, like a bag of groceries from the floor 3 4  8. Performing light activities around your home 4 4  9. Performing heavy activities around your home 3 3  10. Getting into/out of a car 2 3  11. Walking 2 blocks 3 3  12. Walking 1 mile 3 2  13. Going up/down 10 stairs (1 flight) 3 4  14. Standing for 1  hour 3 3  15.  sitting for 1 hour 4 4  16. Running on even ground 1 1  17. Running on uneven ground 1 1  18. Making sharp turns while running fast 1 0  19. Hopping  2 4  20. Rolling over in bed 3 4  Score total:  56/80 62/80     COGNITION: Overall cognitive status: Within functional limits for tasks assessed    EDEMA: Increased edema on the Rt knee  POSTURE: rounded shoulders and forward head  PALPATION: TTP at Rt mid and distal quadriceps and medial aspect of Rt knee  LOWER EXTREMITY MMT:  MMT Right eval Left eval Right  07/05/24  Hip flexion 4 pain in hip 5 4  Hip extension     Hip abduction     Hip adduction     Hip internal rotation     Hip external rotation     Knee flexion 4+ 4+ 4+  Knee extension 4+ 5 4+  Ankle dorsiflexion 5 5   Ankle plantarflexion 5 5   Ankle inversion     Ankle eversion      (Blank rows = not tested)  LOWER EXTREMITY ROM:  ROM Right eval Left eval Right  19/26  Hip flexion     Hip extension     Hip abduction     Hip adduction     Hip internal rotation     Hip external rotation     Knee flexion 121 135 126 (LEFT 128)  Knee extension 10 6 / 2 5/ 0  Ankle dorsiflexion     Ankle plantarflexion     Ankle inversion     Ankle eversion      (Blank rows = not tested)  FUNCTIONAL TESTS:  5 times sit to stand: 13.24 sec - increased Rt knee pain SLS: unable to perform bil without UE support  Tandem balance: Rt forward 6 sec; Lt forward 2 sec  07/05/24 SLS: unable to perform on the L, 2 sec on R DGI TOTAL SCORE: __21___/ 24 - deficits, horizontal head turns, stepping over obstacle and stairs (uses rail)   GAIT: Distance walked: 90 feet Assistive device utilized: None Level of assistance: Complete Independence Comments: Decreased stance time on Rt leg with compensation by leaning to the Lt  TREATMENT DATE:   07/19/24 Therapeutic Exercises and Activities NuStep Level 6 x 7:30 min with PTA present to discuss current functional status Gastroc Stretch x 3 reps, 30 sec each Chair quad stretch bil x 3 reps, 30-45 sec holds Rt Hip flexor stretch with Lt foot on 8, then switch, x 2 reps, x 30-45 sec Standing T in with single (but at times used both due to instability) UE support x12 each side Rt HS curls 4# x 15, then x 10 on Lt while working SLS on Rt Rt hip ext 4# x 12, with VC's to decrease forward trunk lean Vibration Plate x 1 rep each for HS stretch x 30 sec Leg Press, seat on 7, 90# bil x 20, calf press x 20, then Rt LE only 90# x 20 with V's during for full (as able) ext and flex  07/17/24 Recumbent bike L3 x 6' PT present to discuss status and plan for session Gastroc stretch slant board alt with HS stretch foot on 3rd step x3 rounds 30 each Chair quad stretch Rt 2x30 Hip flexor stetch foot on 3rd step 3x10 each Wall squats 2x10, with final rep hold as static squat x10 Standing on foam march taps to 3rd step with light single UE support 1x10 each side, then alt x 20 Leg press seat 7 90lb bil LE (Pt chose to increase weight today) x20, then used edge of board for calf raise x 15 Standing T at chair with single UE support x12 each side Hamstring curls 4# x 10 Hip ext 4# x10   07/10/24 Recumbent bike L3 x 5' PT present to discuss status and plan for session Gastroc stretch slant board alt with HS stretch foot on 3rd step x3 rounds 30 each Hip flexor stetch foot on 3rd step 3x10 each March taps to 3rd step alt LE standing on foam Wall squats 2x10, with final rep hold as static squat x10 Standing on foam march taps to 3rd step with light single UE support 1x10 each side, then alt x 20 Floor transfer practice with single UE support on mat table x3 rounds Leg press seat 7 90lb bil LE (Pt chose to increase weight today) x20, then in stagger stance with cue for 70% Rt LE 30% Lt LE effort  x12 Standing T at counter with single UE support x12 each side    PATIENT EDUCATION:  Education details: POC, HEP Person educated: Patient Education method: Chief Technology Officer Education comprehension: verbalized understanding  HOME EXERCISE PROGRAM: Access Code: [email or web address] URL: [email or web address] Date: 06/03/2024 Prepared by: JulieAccess Code: [email or web address] URL: [email or web address] Date: 07/02/2024 Prepared by: Mliss  Exercises - Seated Hamstring Stretch with Strap  - 2 x daily - 7 x weekly - 1 sets - 3 reps - 60 seconds hold - Wall Quarter Squat  - 1 x daily - 3 x weekly - 2 sets - 10 reps - Standing Romberg to 1/2 Tandem Stance  - 2 x daily - 7 x weekly - 1 sets - 5 reps - max hold - Hip Flexor Stretch on Step  - 1 x daily - 7 x weekly - 1 sets - 3 reps - 30 sec hold - Seated Hip Flexion March with Ankle Weights  - 1 x daily - 3 x weekly - 2 sets - 10 reps  Exercises - Seated Hamstring Stretch with Strap  - 2 x daily - 7 x weekly - 1 sets - 3 reps - 60 seconds hold - Wall Quarter Squat  -  1 x daily - 3 x weekly - 2 sets - 10 reps - Standing Romberg to 1/2 Tandem Stance  - 2 x daily - 7 x weekly - 1 sets - 5 reps - max hold  ASSESSMENT:  CLINICAL IMPRESSION: Pt reports his pain is improving except for his knee locking up on him twice yesterday. However, upon questioning he reports he had had his leg extended x 1 hr each time so encouraged him not to keep leg fully extended as long and take brakes for heel slides. Also encouraged ext stretch with his ankle weight over his quad but to only do this for 1-2 min at a time with heel slide for knee flex in between. He verbalized good understanding of all. Today continued with Rt knee flexibility and strength.   OBJECTIVE IMPAIRMENTS: Abnormal gait, decreased activity tolerance, decreased balance, decreased mobility, difficulty walking, decreased ROM, decreased strength, increased edema, and pain.    ACTIVITY LIMITATIONS: carrying, lifting, bending, sitting, squatting, and stairs  PARTICIPATION LIMITATIONS: cleaning, laundry, community activity, and yard work  PERSONAL FACTORS: Age and 1 comorbidity: Diabetic neuropathy are also affecting patient's functional outcome.   REHAB POTENTIAL: Excellent  CLINICAL DECISION MAKING: Stable/uncomplicated  EVALUATION COMPLEXITY: Low   GOALS: Goals reviewed with patient? Yes  SHORT TERM GOALS: Target date: 06/10/24 Patient will be compliant with HEP Baseline: Goal status: MET 06/11/24  2.  Patient will report no pain when riding his stationary bike daily Baseline:  Goal status: MET 06/11/24  LONG TERM GOALS: Target date: 09/03/24  Patient will be able to bike 6 miles on his stationary bike pain free with the seat at it's regular height  Baseline:  Goal status:  MET  2.  Patient will improve bilateral SLS to at least 5 seconds without UE support  Baseline:  Goal status:IN PROGRESS 07/05/24   3.  Patient will improve his LEFS score by at least 9 points to demonstrate functional improvement  Baseline: 56/80 Goal status: IN PROGRESS 07/05/24  4.  Patient will improve right knee flexion AROM to be at least 135 to be equal bilaterally  Baseline: 121 Goal status: IN PROGRESS 07/05/24  5.  Patient able to step over obstacles when walking without difficulty and negotiate curbs without UE assist demonstrating improved LE strength and balance. Baseline:  Goal status: NEW     PLAN:  PT FREQUENCY: 2x/week  PT DURATION: 8 weeks  PLANNED INTERVENTIONS: 97164- PT Re-evaluation, 97110-Therapeutic exercises, 97530- Therapeutic activity, [id number]- Neuromuscular re-education, 97535- Self Care, 02859- Manual therapy, [phone]- Gait training, [phone]- Electrical stimulation (unattended), [phone]- Electrical stimulation (manual), 20560 (1-2 muscles), 20561 (3+ muscles)- Dry Needling, Patient/Family education, Balance training, Taping, Manual lymph  drainage, Scar mobilization, and Cryotherapy  PLAN FOR NEXT SESSION: Focus on ROM, knee strength and dynamic balance exercises, including stepping over obstacles and curbs.   Berwyn Knights, PTA 07/19/24 10:01 AM    "

## 2024-07-23 ENCOUNTER — Ambulatory Visit: Admitting: Physical Therapy

## 2024-07-25 ENCOUNTER — Ambulatory Visit

## 2024-07-25 DIAGNOSIS — R252 Cramp and spasm: Secondary | ICD-10-CM

## 2024-07-25 DIAGNOSIS — M25561 Pain in right knee: Secondary | ICD-10-CM

## 2024-07-25 DIAGNOSIS — R293 Abnormal posture: Secondary | ICD-10-CM

## 2024-07-25 DIAGNOSIS — M25661 Stiffness of right knee, not elsewhere classified: Secondary | ICD-10-CM

## 2024-07-25 DIAGNOSIS — M6281 Muscle weakness (generalized): Secondary | ICD-10-CM | POA: Diagnosis not present

## 2024-07-25 DIAGNOSIS — R262 Difficulty in walking, not elsewhere classified: Secondary | ICD-10-CM

## 2024-07-25 NOTE — Therapy (Signed)
 " OUTPATIENT PHYSICAL THERAPY LOWER EXTREMITY TREATMENT   Patient Name: Fernando Taylor MRN: [id number] DOB:03/18/1941, 84 y.o., male Today's Date: 07/25/2024  END OF SESSION:  PT End of Session - 07/25/24 1154     Visit Number 12    Number of Visits --    Date for Recertification  09/04/23    Authorization Type Medicare    PT Start Time 1148    PT Stop Time 1230    PT Time Calculation (min) 42 min    Activity Tolerance Patient tolerated treatment well    Behavior During Therapy WFL for tasks assessed/performed               Past Medical History:  Diagnosis Date   Allergies    Diabetes mellitus (HCC)    History of prostate cancer 2010   NO RADIATION   History of tremor    NOT HEN RESTING, MOSTLY INTENTION TREMOR AND AT A CERTAIN ANGLE, ABLE TO WRITE. HAS NOT HAD MORE EVALUATION, WAS BEING OBSERVED.   HLD (hyperlipidemia)    Hx of atrial fibrillation without current medication 2014   S/P ABLATION IN [place],    Hypertension    Paroxysmal atrial fibrillation (HCC)    Thrombocytopenia    Tremor    Past Surgical History:  Procedure Laterality Date   ATRIAL FIBRILLATION ABLATION  2014   S/P   COLONOSCOPY     DUE NOW, APRIL 2020   KIDNEY STONE SURGERY     PROSTATECTOMY  2010   Patient Active Problem List   Diagnosis Date Noted   Peripheral arterial disease 09/09/2022   Persistent atrial fibrillation (HCC) 07/25/2019   Carotid artery disease 07/25/2019   Hyperlipidemia 07/25/2019   Pain due to onychomycosis of toenails of both feet 07/19/2019   Diabetic neuropathy (HCC) 07/19/2019    PCP: Regino Slater, MD  REFERRING PROVIDER: Melodi Lerner, MD  REFERRING DIAG:  Z51.89 (ICD-10-CM) - Encounter for other specified aftercare    THERAPY DIAG:  Muscle weakness (generalized)  Cramp and spasm  Difficulty in walking, not elsewhere classified  Acute pain of right knee  Stiffness of right knee, not elsewhere classified  Abnormal posture  Rationale for  Evaluation and Treatment: Rehabilitation  ONSET DATE: 04/16/24  SUBJECTIVE:   SUBJECTIVE STATEMENT: Patient reports still having episodes of the knee locking up.  States it is usually when he has been immobile for an extended amount of time.   PERTINENT HISTORY: Underwent arthroscopy with meniscal debridement following complex medial meniscal tear on Rt knee. Diabetic neuropathy  PAIN:  No pain  PRECAUTIONS: None  RED FLAGS: None   WEIGHT BEARING RESTRICTIONS: No  FALLS:  Has patient fallen in last 6 months? Yes. Number of falls Tripped going into his attic which caused the original injury  LIVING ENVIRONMENT: Lives with: lives with their spouse Lives in: House/apartment Stairs: Yes but does not use them  OCCUPATION: Retired  PLOF: Independent  PATIENT GOALS: Wants to be able to walk normally & get back to biking  NEXT MD VISIT: 07/05/23  OBJECTIVE:  Note: Objective measures were completed at Evaluation unless otherwise noted.  PATIENT SURVEYS:  LEFS  Extreme difficulty/unable (0), Quite a bit of difficulty (1), Moderate difficulty (2), Little difficulty (3), No difficulty (4) Survey date:  05/27/24 19/26  Any of your usual work, housework or school activities 4 4  2. Usual hobbies, recreational or sporting activities 4 4  3. Getting into/out of the bath 4 4  4. Walking between rooms  4 4  5. Putting on socks/shoes 3 3  6. Squatting  1 3  7. Lifting an object, like a bag of groceries from the floor 3 4  8. Performing light activities around your home 4 4  9. Performing heavy activities around your home 3 3  10. Getting into/out of a car 2 3  11. Walking 2 blocks 3 3  12. Walking 1 mile 3 2  13. Going up/down 10 stairs (1 flight) 3 4  14. Standing for 1 hour 3 3  15.  sitting for 1 hour 4 4  16. Running on even ground 1 1  17. Running on uneven ground 1 1  18. Making sharp turns while running fast 1 0  19. Hopping  2 4  20. Rolling over in bed 3 4  Score  total:  56/80 62/80     COGNITION: Overall cognitive status: Within functional limits for tasks assessed    EDEMA: Increased edema on the Rt knee  POSTURE: rounded shoulders and forward head  PALPATION: TTP at Rt mid and distal quadriceps and medial aspect of Rt knee  LOWER EXTREMITY MMT:  MMT Right eval Left eval Right  07/05/24  Hip flexion 4 pain in hip 5 4  Hip extension     Hip abduction     Hip adduction     Hip internal rotation     Hip external rotation     Knee flexion 4+ 4+ 4+  Knee extension 4+ 5 4+  Ankle dorsiflexion 5 5   Ankle plantarflexion 5 5   Ankle inversion     Ankle eversion      (Blank rows = not tested)  LOWER EXTREMITY ROM:  ROM Right eval Left eval Right  19/26  Hip flexion     Hip extension     Hip abduction     Hip adduction     Hip internal rotation     Hip external rotation     Knee flexion 121 135 126 (LEFT 128)  Knee extension 10 6 / 2 5/ 0  Ankle dorsiflexion     Ankle plantarflexion     Ankle inversion     Ankle eversion      (Blank rows = not tested)  FUNCTIONAL TESTS:  5 times sit to stand: 13.24 sec - increased Rt knee pain SLS: unable to perform bil without UE support  Tandem balance: Rt forward 6 sec; Lt forward 2 sec  07/05/24 SLS: unable to perform on the L, 2 sec on R DGI TOTAL SCORE: __21___/ 24 - deficits, horizontal head turns, stepping over obstacle and stairs (uses rail)   GAIT: Distance walked: 90 feet Assistive device utilized: None Level of assistance: Complete Independence Comments: Decreased stance time on Rt leg with compensation by leaning to the Lt  TREATMENT DATE:  07/25/24 NuStep Level 6 x 7 min with PT present to discuss current functional status Rocker board x 2 min Standing gastroc stretch 10 sec x 10 both Chair quad stretch bil x 3 reps, 30-45 sec holds Standing  T in with single (but at times used both due to instability) UE support x12 each side (patient ended up doing twice the amount because he wanted to turn the opposite direction and do more) Rt Hip flexor stretch with Lt foot on 8, then switch, x 2 reps, x 30-45 sec Rt HS curls 4# x 15, then x 10 on Lt while working SLS on Rt Rt hip ext 4# x 12, with VC's to decrease forward trunk lean Vibration Plate x 1 rep each for HS stretch x 30 sec   07/19/24 Therapeutic Exercises and Activities NuStep Level 6 x 7:30 min with PTA present to discuss current functional status Gastroc Stretch x 3 reps, 30 sec each Chair quad stretch bil x 3 reps, 30-45 sec holds Rt Hip flexor stretch with Lt foot on 8, then switch, x 2 reps, x 30-45 sec Standing T in with single (but at times used both due to instability) UE support x12 each side Rt HS curls 4# x 15, then x 10 on Lt while working SLS on Rt Rt hip ext 4# x 12, with VC's to decrease forward trunk lean Vibration Plate x 1 rep each for HS stretch x 30 sec Leg Press, seat on 7, 90# bil x 20, calf press x 20, then Rt LE only 90# x 20 with V's during for full (as able) ext and flex  07/17/24 Recumbent bike L3 x 6' PT present to discuss status and plan for session Gastroc stretch slant board alt with HS stretch foot on 3rd step x3 rounds 30 each Chair quad stretch Rt 2x30 Hip flexor stetch foot on 3rd step 3x10 each Wall squats 2x10, with final rep hold as static squat x10 Standing on foam march taps to 3rd step with light single UE support 1x10 each side, then alt x 20 Leg press seat 7 90lb bil LE (Pt chose to increase weight today) x20, then used edge of board for calf raise x 15 Standing T at chair with single UE support x12 each side Hamstring curls 4# x 10 Hip ext 4# x10   PATIENT EDUCATION:  Education details: POC, HEP Person educated: Patient Education method: Chief Technology Officer Education comprehension: verbalized understanding  HOME  EXERCISE PROGRAM: Access Code: [email or web address] URL: [email or web address] Date: 06/03/2024 Prepared by: JulieAccess Code: [email or web address] URL: [email or web address] Date: 07/02/2024 Prepared by: Mliss  Exercises - Seated Hamstring Stretch with Strap  - 2 x daily - 7 x weekly - 1 sets - 3 reps - 60 seconds hold - Wall Quarter Squat  - 1 x daily - 3 x weekly - 2 sets - 10 reps - Standing Romberg to 1/2 Tandem Stance  - 2 x daily - 7 x weekly - 1 sets - 5 reps - max hold - Hip Flexor Stretch on Step  - 1 x daily - 7 x weekly - 1 sets - 3 reps - 30 sec hold - Seated Hip Flexion March with Ankle Weights  - 1 x daily - 3 x weekly - 2 sets - 10 reps  Exercises - Seated Hamstring Stretch with Strap  - 2 x daily - 7 x weekly - 1 sets - 3 reps - 60 seconds hold - Wall Quarter Squat  - 1  x daily - 3 x weekly - 2 sets - 10 reps - Standing Romberg to 1/2 Tandem Stance  - 2 x daily - 7 x weekly - 1 sets - 5 reps - max hold  ASSESSMENT:  CLINICAL IMPRESSION: Pt is still experiencing some of the locking up symptoms.  He is very tight in hamstrings and demonstrates weakness in hips that make balance activities difficult.  He completed all tasks today with no increase in pain.  He would benefit from continued skilled PT to progress toward goals below.    OBJECTIVE IMPAIRMENTS: Abnormal gait, decreased activity tolerance, decreased balance, decreased mobility, difficulty walking, decreased ROM, decreased strength, increased edema, and pain.   ACTIVITY LIMITATIONS: carrying, lifting, bending, sitting, squatting, and stairs  PARTICIPATION LIMITATIONS: cleaning, laundry, community activity, and yard work  PERSONAL FACTORS: Age and 1 comorbidity: Diabetic neuropathy are also affecting patient's functional outcome.   REHAB POTENTIAL: Excellent  CLINICAL DECISION MAKING: Stable/uncomplicated  EVALUATION COMPLEXITY: Low   GOALS: Goals reviewed with patient? Yes  SHORT TERM GOALS:  Target date: 06/10/24 Patient will be compliant with HEP Baseline: Goal status: MET 06/11/24  2.  Patient will report no pain when riding his stationary bike daily Baseline:  Goal status: MET 06/11/24  LONG TERM GOALS: Target date: 09/03/24  Patient will be able to bike 6 miles on his stationary bike pain free with the seat at it's regular height  Baseline:  Goal status:  MET  2.  Patient will improve bilateral SLS to at least 5 seconds without UE support  Baseline:  Goal status:IN PROGRESS 07/05/24   3.  Patient will improve his LEFS score by at least 9 points to demonstrate functional improvement  Baseline: 56/80 Goal status: IN PROGRESS 07/05/24  4.  Patient will improve right knee flexion AROM to be at least 135 to be equal bilaterally  Baseline: 121 Goal status: IN PROGRESS 07/05/24  5.  Patient able to step over obstacles when walking without difficulty and negotiate curbs without UE assist demonstrating improved LE strength and balance. Baseline:  Goal status: NEW     PLAN:  PT FREQUENCY: 2x/week  PT DURATION: 8 weeks  PLANNED INTERVENTIONS: 97164- PT Re-evaluation, 97110-Therapeutic exercises, 97530- Therapeutic activity, [id number]- Neuromuscular re-education, 97535- Self Care, [id number]- Manual therapy, [phone]- Gait training, [phone]- Electrical stimulation (unattended), [phone]- Electrical stimulation (manual), 20560 (1-2 muscles), 20561 (3+ muscles)- Dry Needling, Patient/Family education, Balance training, Taping, Manual lymph drainage, Scar mobilization, and Cryotherapy  PLAN FOR NEXT SESSION: Focus on ROM, knee strength and dynamic balance exercises, including stepping over obstacles and curbs.   Delon B. Carlina Derks, PT 07/25/24 2:21 PM [facility] [address], [address] [place], [place] [address] Phone # [phone] Fax [phone]    "

## 2024-07-29 ENCOUNTER — Ambulatory Visit: Admitting: Physical Therapy

## 2024-07-31 ENCOUNTER — Ambulatory Visit: Admitting: Physical Therapy

## 2024-08-12 ENCOUNTER — Ambulatory Visit: Admitting: Rehabilitation

## 2024-08-15 ENCOUNTER — Ambulatory Visit: Admitting: Rehabilitation

## 2024-08-19 ENCOUNTER — Ambulatory Visit: Admitting: Physical Therapy

## 2024-08-21 ENCOUNTER — Ambulatory Visit: Admitting: Physical Therapy

## 2024-08-27 ENCOUNTER — Ambulatory Visit: Admitting: Rehabilitation

## 2024-08-29 ENCOUNTER — Ambulatory Visit: Admitting: Rehabilitation
